# Patient Record
Sex: Male | Born: 1950 | Race: White | Hispanic: No | Marital: Married | Smoking: Never smoker
Health system: Southern US, Community
[De-identification: ages and names within clinical notes are randomized; demographics above are authoritative.]

## PROBLEM LIST (undated history)

## (undated) DIAGNOSIS — I10 Essential (primary) hypertension: Secondary | ICD-10-CM

## (undated) DIAGNOSIS — N529 Male erectile dysfunction, unspecified: Secondary | ICD-10-CM

## (undated) DIAGNOSIS — R7301 Impaired fasting glucose: Secondary | ICD-10-CM

## (undated) DIAGNOSIS — I2510 Atherosclerotic heart disease of native coronary artery without angina pectoris: Secondary | ICD-10-CM

## (undated) DIAGNOSIS — B029 Zoster without complications: Secondary | ICD-10-CM

## (undated) DIAGNOSIS — E785 Hyperlipidemia, unspecified: Secondary | ICD-10-CM

## (undated) HISTORY — DX: Zoster without complications: B02.9

## (undated) HISTORY — DX: Essential (primary) hypertension: I10

## (undated) HISTORY — PX: OTHER SURGICAL HISTORY: SHX169

## (undated) HISTORY — DX: Male erectile dysfunction, unspecified: N52.9

## (undated) HISTORY — DX: Hyperlipidemia, unspecified: E78.5

## (undated) HISTORY — DX: Impaired fasting glucose: R73.01

## (undated) HISTORY — DX: Atherosclerotic heart disease of native coronary artery without angina pectoris: I25.10

---
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
## 1994-05-21 HISTORY — PX: CORONARY ARTERY BYPASS GRAFT: SHX141

## 2000-02-09 ENCOUNTER — Ambulatory Visit (HOSPITAL_COMMUNITY): Admission: RE | Admit: 2000-02-09 | Discharge: 2000-02-09 | Payer: Self-pay | Admitting: Gastroenterology

## 2006-05-21 HISTORY — PX: SHOULDER SURGERY: SHX246

## 2006-07-10 ENCOUNTER — Ambulatory Visit (HOSPITAL_BASED_OUTPATIENT_CLINIC_OR_DEPARTMENT_OTHER): Admission: RE | Admit: 2006-07-10 | Discharge: 2006-07-10 | Payer: Self-pay | Admitting: Orthopedic Surgery

## 2011-01-10 ENCOUNTER — Ambulatory Visit (INDEPENDENT_AMBULATORY_CARE_PROVIDER_SITE_OTHER): Payer: PRIVATE HEALTH INSURANCE | Admitting: Family Medicine

## 2011-01-10 ENCOUNTER — Encounter: Payer: Self-pay | Admitting: Family Medicine

## 2011-01-10 VITALS — BP 142/82 | HR 64 | Ht 71.0 in | Wt 215.0 lb

## 2011-01-10 DIAGNOSIS — I10 Essential (primary) hypertension: Secondary | ICD-10-CM | POA: Insufficient documentation

## 2011-01-10 DIAGNOSIS — Z125 Encounter for screening for malignant neoplasm of prostate: Secondary | ICD-10-CM

## 2011-01-10 DIAGNOSIS — Z0000 Encounter for general adult medical examination without abnormal findings: Secondary | ICD-10-CM

## 2011-01-10 DIAGNOSIS — Z79899 Other long term (current) drug therapy: Secondary | ICD-10-CM

## 2011-01-10 DIAGNOSIS — I2510 Atherosclerotic heart disease of native coronary artery without angina pectoris: Secondary | ICD-10-CM | POA: Insufficient documentation

## 2011-01-10 LAB — COMPREHENSIVE METABOLIC PANEL
ALT: 24 U/L (ref 0–53)
Albumin: 5.1 g/dL (ref 3.5–5.2)
Alkaline Phosphatase: 73 U/L (ref 39–117)
CO2: 28 mEq/L (ref 19–32)
Glucose, Bld: 87 mg/dL (ref 70–99)
Potassium: 4 mEq/L (ref 3.5–5.3)
Sodium: 139 mEq/L (ref 135–145)
Total Protein: 6.8 g/dL (ref 6.0–8.3)

## 2011-01-10 LAB — POCT URINALYSIS DIPSTICK
Bilirubin, UA: NEGATIVE
Glucose, UA: NEGATIVE
Leukocytes, UA: NEGATIVE
Nitrite, UA: NEGATIVE

## 2011-01-10 LAB — LIPID PANEL
LDL Cholesterol: 96 mg/dL (ref 0–99)
Triglycerides: 78 mg/dL (ref <150)

## 2011-01-10 NOTE — Progress Notes (Signed)
Jackson Reed is a 60 y.o. male who presents for a complete physical.  He has the following concerns: Here to establish care.  He has no specific concerns.  He is fasting for labs--last labs were done at [facility] [place]--believes it was in March (and doesn't recall which tests done).  Previously took Niaspan (in addition to statin) after [facility] heart lab studies done (particle size). Stopped it due to possible concern using it together with Lipitor.  Had flushing from OTC Niacin. Started the red yeast rice on his own, and has been taking for about a year.  Pt states that BP usually 120/70 at his office  There is no immunization history on file for this patient. Can't recall last tetanus shot--had a laceration treated in ER about 5 years ago.  Will check records Never had pneumovax Last colonoscopy: spring 2012 with Dr. Kinnie Scales Last PSA: unsure if done with labs in Spring or not  Past Medical History  Diagnosis Date  . CAD (coronary artery disease)     s/p CABG 1996 (sees Dr. Alanda Amass)  . Erectile dysfunction   . Hyperlipidemia   . Hypertension     Past Surgical History  Procedure Date  . Appendectomy 1968  . Coronary artery bypass graft 1996  . Shoulder surgery 2008    (arthroscopic) L for chronic dislocation Dr. Eulah Pont    History   Social History  . Marital Status: Single    Spouse Name: N/A    Number of Children: 2  . Years of Education: N/A   Occupational History  . oral surgeon    Social History Main Topics  . Smoking status: Never Smoker   . Smokeless tobacco: Never Used  . Alcohol Use: Yes     3-4 glasses wine a week.  . Drug Use: No  . Sexually Active: Yes -- Male partner(s)   Other Topics Concern  . Not on file   Social History Narrative   Engaged, 1 son and 1 daughter    Family History  Problem Relation Age of Onset  . Cancer Mother     renal cell CA and breast CA  . Cancer Father     prostate cancer in his 22's  . Alzheimer's  disease Father   . Heart disease Father     CABG  . Cancer Brother     lung cancer (nonsmoker)    Current outpatient prescriptions:amLODipine (NORVASC) 5 MG tablet, Take 5 mg by mouth daily.  , Disp: , Rfl: ;  Ascorbic Acid (VITAMIN C) 1000 MG tablet, Take 1,000 mg by mouth daily.  , Disp: , Rfl: ;  aspirin 81 MG tablet, Take 81 mg by mouth daily.  , Disp: , Rfl: ;  atorvastatin (LIPITOR) 40 MG tablet, Take 40 mg by mouth daily.  , Disp: , Rfl: ;  b complex vitamins tablet, Take 1 tablet by mouth daily.  , Disp: , Rfl:  Coenzyme Q10 300 MG CAPS, Take 1 capsule by mouth daily.  , Disp: , Rfl: ;  Fiber TABS, Take 1 tablet by mouth daily.  , Disp: , Rfl: ;  lisinopril-hydrochlorothiazide (PRINZIDE,ZESTORETIC) 20-12.5 MG per tablet, Take 0.5 tablets by mouth daily.  , Disp: , Rfl: ;  OMEGA 3 1200 MG CAPS, Take 2 capsules by mouth daily.  , Disp: , Rfl: ;  pindolol (VISKEN) 5 MG tablet, Take 2.5 mg by mouth daily.  , Disp: , Rfl:  Red Yeast Rice 600 MG TABS, Take 2  tablets by mouth daily.  , Disp: , Rfl: ;  vitamin E 400 UNIT capsule, Take 400 Units by mouth daily.  , Disp: , Rfl:   No Known Allergies  ROS: The patient denies anorexia, fever, weight changes, headaches,  vision loss, decreased hearing, ear pain, hoarseness, chest pain, palpitations, dizziness, syncope, dyspnea on exertion, cough, swelling, nausea, vomiting, diarrhea, constipation, abdominal pain, melena, hematochezia, indigestion/heartburn, hematuria, incontinence, nocturia, weakened urine stream, dysuria, genital lesions, joint pains, numbness, tingling, weakness, tremor, suspicious skin lesions, depression, anxiety, abnormal bleeding/bruising, or enlarged lymph nodes. +ED, but improved, infrequent problems, only when very tired  PHYSICAL EXAM:  BP 142/82  Pulse 64  Ht 5\' 11"  (1.803 m)  Wt 215 lb (97.523 kg)  BMI 29.99 kg/m2  General Appearance:    Alert, cooperative, no distress, appears stated age  Head:    Normocephalic,  without obvious abnormality, atraumatic  Eyes:    PERRL, conjunctiva/corneas clear, EOM's intact, fundi    benign  Ears:    Normal TM's and external ear canals  Nose:   Nares normal, mucosa normal, no drainage or sinus   tenderness  Throat:   Lips, mucosa, and tongue normal; teeth and gums normal  Neck:   Supple, no lymphadenopathy;  thyroid:  no   enlargement/tenderness/nodules; no carotid   bruit or JVD  Back:    Spine nontender, no curvature, ROM normal, no CVA     tenderness  Lungs:     Clear to auscultation bilaterally without wheezes, rales or     ronchi; respirations unlabored  Chest Wall:    No tenderness or deformity. WHSS   Heart:    Regular rate and rhythm, S1 and S2 normal, no murmur, rub   or gallop  Breast Exam:    No chest wall tenderness, masses or gynecomastia  Abdomen:     Soft, non-tender, nondistended, normoactive bowel sounds,    no masses, no hepatosplenomegaly  Genitalia:    Normal male external genitalia without lesions.  Testicles without masses.  No inguinal hernias.  Rectal:    Normal sphincter tone, no masses or tenderness; guaiac negative stool.  Prostate smooth, no nodules, not enlarged.  Extremities:   No clubbing, cyanosis or edema  Pulses:   2+ and symmetric all extremities  Skin:   Skin color, texture, turgor normal, no rashes or lesions  Lymph nodes:   Cervical, supraclavicular, and axillary nodes normal  Neurologic:   CNII-XII intact, normal strength, sensation and gait; reflexes 2+ and symmetric throughout          Psych:   Normal mood, affect, hygiene and grooming.    ASSESSMENT/PLAN: 1. Routine general medical examination at a health care facility  POCT Urinalysis Dipstick, Visual acuity screening  2. Special screening for malignant neoplasm of prostate  PSA  3. Encounter for long-term (current) use of other medications  CBC with Differential, Comprehensive metabolic panel, Lipid panel  4. CAD (coronary artery disease)    5. Essential hypertension,  benign      Discussed PSA screening (risks/benefits), recommended at least 30 minutes of aerobic activity at least 5 days/week; proper sunscreen use reviewed; healthy diet and alcohol recommendations (less than or equal to 2 drinks/day) reviewed; regular seatbelt use; changing batteries in smoke detectors. Self-testicular exams. Immunization recommendations discussed--will look into when last tetanus was.  Discussed Zostavax and will look into insurance coverage.  Colonoscopy recommendations reviewed--UTD, due again in 5 years.  Stop red yeast rice--continue with Lipitor.   Can consider restarting  Niacin/Niaspan  Continue to monitor BP's at work, and ensure <130/80

## 2011-01-10 NOTE — Patient Instructions (Addendum)
HEALTH MAINTENANCE RECOMMENDATIONS:  It is recommended that you get at least 30 minutes of aerobic exercise at least 5 days/week (for weight loss, you may need as much as 60-90 minutes). This can be any activity that gets your heart rate up. This can be divided in 10-15 minute intervals if needed, but try and build up your endurance at least once a week.  Weight bearing exercise is also recommended twice weekly.  Eat a healthy diet with lots of vegetables, fruits and fiber.  "Colorful" foods have a lot of vitamins (ie green vegetables, tomatoes, red peppers, etc).  Limit sweet tea, regular sodas and alcoholic beverages, all of which has a lot of calories and sugar.  Up to 2 alcoholic drinks daily may be beneficial for women (unless trying to lose weight, watch sugars).  Drink a lot of water.  Sunscreen of at least SPF 30 should be used on all sun-exposed parts of the skin when outside between the hours of 10 am and 4 pm (not just when at beach or pool, but even with exercise, golf, tennis, and yard work!)  Use a sunscreen that says "broad spectrum" so it covers both UVA and UVB rays, and make sure to reapply every 1-2 hours.  Remember to change the batteries in your smoke detectors when changing your clock times in the spring and fall.  Use your seat belt every time you are in a car, and please drive safely and not be distracted with cell phones and texting while driving.   Please try and see when your last tetanus shot was Check with your insurance regarding Zostavax (shingles vaccine coverage)  Stop red yeast rice

## 2011-01-11 ENCOUNTER — Encounter: Payer: Self-pay | Admitting: Family Medicine

## 2011-01-11 ENCOUNTER — Telehealth: Payer: Self-pay | Admitting: *Deleted

## 2011-01-11 LAB — CBC WITH DIFFERENTIAL/PLATELET
Basophils Relative: 0 % (ref 0–1)
Hemoglobin: 14.3 g/dL (ref 13.0–17.0)
Lymphs Abs: 1.4 10*3/uL (ref 0.7–4.0)
Monocytes Relative: 9 % (ref 3–12)
Neutro Abs: 3.7 10*3/uL (ref 1.7–7.7)
Neutrophils Relative %: 63 % (ref 43–77)
RBC: 4.61 MIL/uL (ref 4.22–5.81)

## 2011-01-11 LAB — PSA: PSA: 1.69 ng/mL (ref <=4.00)

## 2011-01-11 NOTE — Telephone Encounter (Signed)
Spoke with patient he is going to instead of having his Lipitor increased to 80mg , try OTC niacin. He will call back to schedule lab visit for 3rd week in Nov in the am for fasting lipid.

## 2011-01-26 ENCOUNTER — Telehealth: Payer: Self-pay | Admitting: Family Medicine

## 2011-01-26 MED ORDER — AMLODIPINE BESYLATE 5 MG PO TABS: 5.0000 mg | ORAL_TABLET | Freq: Every day | ORAL | Status: DC

## 2011-01-26 MED ORDER — PINDOLOL 5 MG PO TABS: 2.5000 mg | ORAL_TABLET | Freq: Every day | ORAL | Status: DC

## 2011-01-26 NOTE — Telephone Encounter (Signed)
Reordered med

## 2011-02-22 ENCOUNTER — Telehealth: Payer: Self-pay | Admitting: Family Medicine

## 2011-02-22 DIAGNOSIS — E7800 Pure hypercholesterolemia, unspecified: Secondary | ICD-10-CM

## 2011-02-22 DIAGNOSIS — I10 Essential (primary) hypertension: Secondary | ICD-10-CM

## 2011-02-22 MED ORDER — LISINOPRIL-HYDROCHLOROTHIAZIDE 20-12.5 MG PO TABS: 0.5000 | ORAL_TABLET | Freq: Every day | ORAL | Status: DC

## 2011-02-22 MED ORDER — ATORVASTATIN CALCIUM 40 MG PO TABS: 40.0000 mg | ORAL_TABLET | Freq: Every day | ORAL | Status: DC

## 2011-02-22 MED ORDER — AMLODIPINE BESYLATE 5 MG PO TABS: 5.0000 mg | ORAL_TABLET | Freq: Every day | ORAL | Status: DC

## 2011-02-22 NOTE — Telephone Encounter (Signed)
Advise pt e-rx'd (6 months of BP meds, Lipitor to last until due for labs in November (2 mos)

## 2011-02-22 NOTE — Telephone Encounter (Signed)
Called patient to inform him that meds were called in by Dr.Knapp. 6 months of BP meds and 2 months of Lipitor. Patient is scheduled for fasting labs 04/05/11.

## 2011-02-22 NOTE — Telephone Encounter (Signed)
PT called and asked that we refill his Lisinopril HCTZ, Lipitor, Meldapine at [facility] on [address]

## 2011-03-02 ENCOUNTER — Other Ambulatory Visit: Payer: Self-pay | Admitting: Family Medicine

## 2011-03-02 DIAGNOSIS — N529 Male erectile dysfunction, unspecified: Secondary | ICD-10-CM

## 2011-03-05 NOTE — Telephone Encounter (Signed)
done

## 2011-03-05 NOTE — Telephone Encounter (Signed)
IS THIS OK 

## 2011-04-05 ENCOUNTER — Other Ambulatory Visit: Payer: PRIVATE HEALTH INSURANCE

## 2011-04-05 DIAGNOSIS — Z79899 Other long term (current) drug therapy: Secondary | ICD-10-CM

## 2011-04-05 DIAGNOSIS — I10 Essential (primary) hypertension: Secondary | ICD-10-CM

## 2011-04-05 DIAGNOSIS — I2510 Atherosclerotic heart disease of native coronary artery without angina pectoris: Secondary | ICD-10-CM

## 2011-04-05 LAB — HEPATIC FUNCTION PANEL
ALT: 28 U/L (ref 0–53)
AST: 25 U/L (ref 0–37)
Alkaline Phosphatase: 76 U/L (ref 39–117)
Bilirubin, Direct: 0.2 mg/dL (ref 0.0–0.3)
Indirect Bilirubin: 0.4 mg/dL (ref 0.0–0.9)

## 2011-04-05 LAB — LIPID PANEL: Cholesterol: 143 mg/dL (ref 0–200)

## 2011-04-06 ENCOUNTER — Encounter: Payer: Self-pay | Admitting: Family Medicine

## 2011-04-09 ENCOUNTER — Other Ambulatory Visit: Payer: Self-pay | Admitting: *Deleted

## 2011-04-09 DIAGNOSIS — I2510 Atherosclerotic heart disease of native coronary artery without angina pectoris: Secondary | ICD-10-CM

## 2011-04-09 MED ORDER — NIACIN ER (ANTIHYPERLIPIDEMIC) 500 MG PO TBCR: 500.0000 mg | EXTENDED_RELEASE_TABLET | Freq: Every day | ORAL | Status: DC

## 2011-05-07 ENCOUNTER — Telehealth: Payer: Self-pay | Admitting: Internal Medicine

## 2011-05-07 DIAGNOSIS — E7800 Pure hypercholesterolemia, unspecified: Secondary | ICD-10-CM

## 2011-05-07 MED ORDER — ATORVASTATIN CALCIUM 40 MG PO TABS: 40.0000 mg | ORAL_TABLET | Freq: Every day | ORAL | Status: DC

## 2011-05-14 ENCOUNTER — Telehealth: Payer: Self-pay | Admitting: Internal Medicine

## 2011-05-14 MED ORDER — PINDOLOL 5 MG PO TABS: 2.5000 mg | ORAL_TABLET | Freq: Every day | ORAL | Status: DC

## 2011-05-14 NOTE — Telephone Encounter (Signed)
done

## 2011-05-16 ENCOUNTER — Telehealth: Payer: Self-pay | Admitting: Internal Medicine

## 2011-05-16 NOTE — Telephone Encounter (Signed)
Done

## 2011-05-17 NOTE — Telephone Encounter (Signed)
This is a duplicate--was done 12/24, see previous message

## 2011-05-24 ENCOUNTER — Other Ambulatory Visit: Payer: Self-pay | Admitting: *Deleted

## 2011-05-24 DIAGNOSIS — I2510 Atherosclerotic heart disease of native coronary artery without angina pectoris: Secondary | ICD-10-CM

## 2011-05-24 MED ORDER — PINDOLOL 5 MG PO TABS: 2.5000 mg | ORAL_TABLET | Freq: Every day | ORAL | Status: DC

## 2011-07-02 ENCOUNTER — Other Ambulatory Visit: Payer: Self-pay | Admitting: Family Medicine

## 2011-07-02 ENCOUNTER — Other Ambulatory Visit: Payer: Self-pay | Admitting: *Deleted

## 2011-07-02 DIAGNOSIS — Z79899 Other long term (current) drug therapy: Secondary | ICD-10-CM

## 2011-07-02 DIAGNOSIS — I2510 Atherosclerotic heart disease of native coronary artery without angina pectoris: Secondary | ICD-10-CM

## 2011-07-02 MED ORDER — NIACIN ER (ANTIHYPERLIPIDEMIC) 500 MG PO TBCR: 500.0000 mg | EXTENDED_RELEASE_TABLET | Freq: Every day | ORAL | Status: DC

## 2011-07-02 NOTE — Telephone Encounter (Signed)
Spoke with patient and let him know I refilled his Niaspan and that he is due for lipids and liver panels. He said he will call me back to schedule sometime in March-I put in orders.

## 2011-07-03 ENCOUNTER — Telehealth: Payer: Self-pay | Admitting: Family Medicine

## 2011-07-03 NOTE — Telephone Encounter (Signed)
Pt secretary called and advised pt stated he needed follow up lab.  I set him up for pt .  Please advise if pt does have pending labs.

## 2011-07-12 ENCOUNTER — Encounter: Payer: Self-pay | Admitting: Internal Medicine

## 2011-07-18 ENCOUNTER — Other Ambulatory Visit: Payer: PRIVATE HEALTH INSURANCE

## 2011-07-18 DIAGNOSIS — Z79899 Other long term (current) drug therapy: Secondary | ICD-10-CM

## 2011-07-18 DIAGNOSIS — I2510 Atherosclerotic heart disease of native coronary artery without angina pectoris: Secondary | ICD-10-CM

## 2011-07-18 LAB — HEPATIC FUNCTION PANEL
ALT: 28 U/L (ref 0–53)
AST: 27 U/L (ref 0–37)
Alkaline Phosphatase: 59 U/L (ref 39–117)
Bilirubin, Direct: 0.1 mg/dL (ref 0.0–0.3)
Total Bilirubin: 0.5 mg/dL (ref 0.3–1.2)

## 2011-07-18 LAB — LIPID PANEL: Cholesterol: 159 mg/dL (ref 0–200)

## 2011-07-25 ENCOUNTER — Encounter: Payer: Self-pay | Admitting: Family Medicine

## 2011-07-25 ENCOUNTER — Ambulatory Visit (INDEPENDENT_AMBULATORY_CARE_PROVIDER_SITE_OTHER): Payer: PRIVATE HEALTH INSURANCE | Admitting: Family Medicine

## 2011-07-25 DIAGNOSIS — I10 Essential (primary) hypertension: Secondary | ICD-10-CM

## 2011-07-25 DIAGNOSIS — I2510 Atherosclerotic heart disease of native coronary artery without angina pectoris: Secondary | ICD-10-CM

## 2011-07-25 DIAGNOSIS — E7800 Pure hypercholesterolemia, unspecified: Secondary | ICD-10-CM | POA: Insufficient documentation

## 2011-07-25 MED ORDER — ATORVASTATIN CALCIUM 40 MG PO TABS: ORAL_TABLET | ORAL | Status: DC

## 2011-07-25 NOTE — Patient Instructions (Signed)
Follow up with an office visit with your cardiologist. Increase the lipitor to 2 tablets 3days/week, and 1 daily on all other days (since you are hesitant to increase to 80 mg daily).  Continue the Niaspan

## 2011-07-25 NOTE — Progress Notes (Signed)
Pt presents for 6 month f/u.  Had labs done prior to visit  He wonders whether or not stress cardiolite tests should be done yearly  HPI: Hypertension follow-up:  Blood pressures elsewhere are okay.  Denies dizziness, headaches, chest pain.  Denies side effects of medications.  Hyperlipidemia follow-up:  Patient is reportedly following a low-fat, low cholesterol diet.  Compliant with medications and denies medication side effects.  Worries about potential increased risk of ALS with Lipitor.  Tolerating Niaspan without side effects.  Coronary artery disease: He denies any chest pain, palpitations, dyspnea on exertion.  He remains very active--doing spin classes, and [employer] program (slowly introducing). He questions the fact that he gets yearly cardiolite stress tests, last 06/2010, wondering if it is truly necessary.  Hasn't seen the Dr. Alanda Amass in the office for an office visit in quite a while, just for the stress tests. He denies any problems, and feels great.  He recently remarried.  Past Medical History  Diagnosis Date  . CAD (coronary artery disease)     s/p CABG 1996 (sees Dr. Alanda Amass)  . Erectile dysfunction   . Hyperlipidemia   . Hypertension     Past Surgical History  Procedure Date  . Appendectomy 1968  . Coronary artery bypass graft 1996  . Shoulder surgery 2008    (arthroscopic) L for chronic dislocation Dr. Eulah Pont    History   Social History  . Marital Status: Single    Spouse Name: N/A    Number of Children: 2  . Years of Education: N/A   Occupational History  . oral surgeon    Social History Main Topics  . Smoking status: Never Smoker   . Smokeless tobacco: Never Used  . Alcohol Use: Yes     3-4 glasses wine a week.  . Drug Use: No  . Sexually Active: Yes -- Male partner(s)   Other Topics Concern  . Not on file   Social History Narrative   Divorced, and remarried 04/2011. 1 son and 1 daughter (not currently in contact with daughter)    Family  History  Problem Relation Age of Onset  . Cancer Mother     renal cell CA and breast CA  . Cancer Father     prostate cancer in his 79's  . Alzheimer's disease Father   . Heart disease Father     CABG  . Cancer Brother     lung cancer (nonsmoker)   Current Outpatient Prescriptions on File Prior to Visit  Medication Sig Dispense Refill  . amLODipine (NORVASC) 5 MG tablet Take 1 tablet (5 mg total) by mouth daily.  30 tablet  5  . Ascorbic Acid (VITAMIN C) 1000 MG tablet Take 1,000 mg by mouth daily.        Marland Kitchen aspirin 81 MG tablet Take 81 mg by mouth daily.        Marland Kitchen atorvastatin (LIPITOR) 40 MG tablet 2 tablets on Mon, Wed, Fri and 1 tablet daily on all other days  135 tablet  1  . b complex vitamins tablet Take 1 tablet by mouth daily.        . Coenzyme Q10 300 MG CAPS Take 1 capsule by mouth daily.        . Fiber TABS Take 1 tablet by mouth daily.        Marland Kitchen lisinopril-hydrochlorothiazide (PRINZIDE,ZESTORETIC) 20-12.5 MG per tablet Take 0.5 tablets by mouth daily.  45 tablet  1  . niacin (NIASPAN) 500 MG CR tablet  Take 1 tablet (500 mg total) by mouth at bedtime.  30 tablet  2  . OMEGA 3 1200 MG CAPS Take 2 capsules by mouth daily.        . pindolol (VISKEN) 5 MG tablet Take 0.5 tablets (2.5 mg total) by mouth daily.  30 tablet  1  . VIAGRA 100 MG tablet TAKE 1/2 TO 1 TABLET BY MOUTH EVERY DAY AS NEEDED FOR ED  15 tablet  5  . vitamin E 400 UNIT capsule Take 400 Units by mouth daily.          No Known Allergies  ROS: Denies fever, URI symptoms, cough, shortness of breath, chest pain, palpitations, nausea, vomiting, diarrhea, or other concerns.   PHYSICAL EXAM: BP 132/78  Pulse 72  Ht 5\' 11"  (1.803 m)  Wt 220 lb (99.791 kg)  BMI 30.68 kg/m2 Well developed, pleasant male in no distress HEENT:  PERRL, conjunctiva clear, sclera anicteric Neck: no lymphadenopathy, thyromegaly or carotid bruit Heart: regular rate and rhythm without murmur Lungs: clear bilaterally Abdomen: soft,  nontender, nondistended, no organomegaly or mass Extremities:  No edema, 2+ pulses Skin: no rash Psych: normal mood, affect, hygiene and grooming  Lab Results  Component Value Date   CHOL 159 07/18/2011   CHOL 143 04/05/2011   CHOL 163 01/10/2011   Lab Results  Component Value Date   HDL 46 07/18/2011   HDL 41 16/02/9603   HDL 51 01/10/2011   Lab Results  Component Value Date   LDLCALC 97 07/18/2011   LDLCALC 84 04/05/2011   LDLCALC 96 01/10/2011   Lab Results  Component Value Date   TRIG 78 07/18/2011   TRIG 92 04/05/2011   TRIG 78 01/10/2011   Lab Results  Component Value Date   CHOLHDL 3.5 07/18/2011   CHOLHDL 3.5 04/05/2011   CHOLHDL 3.2 01/10/2011   No results found for this basename: LDLDIRECT    ASSESSMENT/PLAN:  1. CAD (coronary artery disease)    2. Essential hypertension, benign    3. Pure hypercholesterolemia  atorvastatin (LIPITOR) 40 MG tablet, Lipid panel, Hepatic function panel   Lipids not at goal--goal LDL<70.  Discussed increasing Niaspan vs increasing Lipitor.  He is hesitant to increase to 80 mg daily, and likely can get by with less, so will increase to 80mg  on MWF and remain at 40mg  on other days.  Recheck labs in 2-3 months. Continue low cholesterol diet  HTN--controlled. Continue checking BP elsewhere  CAD--stable.  I recommended scheduling OV with cardiologist to discuss his concerns.  I don't have reports/results to understanding reasoning behind current strategy of testing, but in general it appears excessive for someone as active as he is, without symptoms.

## 2011-07-26 ENCOUNTER — Encounter: Payer: Self-pay | Admitting: Family Medicine

## 2011-08-16 ENCOUNTER — Telehealth: Payer: Self-pay | Admitting: Internal Medicine

## 2011-08-16 DIAGNOSIS — I2510 Atherosclerotic heart disease of native coronary artery without angina pectoris: Secondary | ICD-10-CM

## 2011-08-16 DIAGNOSIS — I10 Essential (primary) hypertension: Secondary | ICD-10-CM

## 2011-08-16 MED ORDER — PINDOLOL 5 MG PO TABS: 2.5000 mg | ORAL_TABLET | Freq: Every day | ORAL | Status: DC

## 2011-08-16 MED ORDER — AMLODIPINE BESYLATE 5 MG PO TABS: 5.0000 mg | ORAL_TABLET | Freq: Every day | ORAL | Status: DC

## 2011-08-16 MED ORDER — LISINOPRIL-HYDROCHLOROTHIAZIDE 20-12.5 MG PO TABS: 0.5000 | ORAL_TABLET | Freq: Every day | ORAL | Status: DC

## 2011-08-16 NOTE — Telephone Encounter (Signed)
Done

## 2011-08-22 ENCOUNTER — Telehealth: Payer: Self-pay | Admitting: *Deleted

## 2011-08-22 NOTE — Telephone Encounter (Signed)
Spoke with pt.  He is taking 80mg  only 3 days/week, 40 mg on other days.  Hasn't had any myalgias or side effects, has just been hesitant to increase to 80mg  dose.  Discussed increasing to 80mg  every day, to call if he doesn't tolerate, in which case we can try 40mg  of Crestor.  Discussed his pre-diabetic blood sugar--continue exercise, weight loss, cut back on sweets/carbs/alcohol.  He will let us know when he needs refill on lipitor--if tolerating, will need change to 80 mg dose daily.  He will fax labs tomorrow.  Has appt with cardiologist next week

## 2011-08-22 NOTE — Telephone Encounter (Signed)
Patient called and wanted to speak with you directly, you were in with a patient so I got some info. He would still like you to call him to discuss his LDL. He had some labs drawn @ [facility] (for docs) and his LDL is 105, it was 97 on 07/18/11. PSA was 1.52, and his fasting glucose (6hr fast) was 110-he stated that he is working on losing some weight. He wants to talk about maybe trying something different for his lipids, maybe getting back on red yeast rice, he is so concerned over the increase in Lipitor to 80 mg (~4 weeks ago) and his numbers elevating. Also he stated that his liver enzymes were good, as he had a CMET also. He will fax to [place]. Please call. Thanks.

## 2011-08-30 ENCOUNTER — Other Ambulatory Visit: Payer: Self-pay | Admitting: *Deleted

## 2011-08-30 MED ORDER — ATORVASTATIN CALCIUM 80 MG PO TABS: 80.0000 mg | ORAL_TABLET | Freq: Every day | ORAL | Status: DC

## 2011-08-31 ENCOUNTER — Telehealth: Payer: Self-pay | Admitting: Family Medicine

## 2011-08-31 DIAGNOSIS — I10 Essential (primary) hypertension: Secondary | ICD-10-CM

## 2011-08-31 MED ORDER — LISINOPRIL-HYDROCHLOROTHIAZIDE 20-12.5 MG PO TABS: 0.5000 | ORAL_TABLET | Freq: Every day | ORAL | Status: DC

## 2011-08-31 NOTE — Telephone Encounter (Signed)
Done

## 2011-09-19 HISTORY — PX: FINGER SURGERY: SHX640

## 2011-09-25 ENCOUNTER — Other Ambulatory Visit: Payer: PRIVATE HEALTH INSURANCE

## 2011-09-25 DIAGNOSIS — E7800 Pure hypercholesterolemia, unspecified: Secondary | ICD-10-CM

## 2011-09-25 LAB — LIPID PANEL
HDL: 41 mg/dL (ref >39)
Triglycerides: 61 mg/dL (ref <150)

## 2011-09-25 LAB — HEPATIC FUNCTION PANEL
AST: 122 U/L — ABNORMAL HIGH (ref 0–37)
Albumin: 4.2 g/dL (ref 3.5–5.2)
Alkaline Phosphatase: 61 U/L (ref 39–117)
Total Protein: 5.8 g/dL — ABNORMAL LOW (ref 6.0–8.3)

## 2011-09-26 ENCOUNTER — Encounter: Payer: Self-pay | Admitting: Family Medicine

## 2011-09-26 ENCOUNTER — Other Ambulatory Visit: Payer: Self-pay | Admitting: *Deleted

## 2011-09-26 DIAGNOSIS — R748 Abnormal levels of other serum enzymes: Secondary | ICD-10-CM

## 2011-09-27 ENCOUNTER — Other Ambulatory Visit: Payer: Self-pay | Admitting: *Deleted

## 2011-09-27 ENCOUNTER — Telehealth: Payer: Self-pay | Admitting: *Deleted

## 2011-09-27 DIAGNOSIS — E7800 Pure hypercholesterolemia, unspecified: Secondary | ICD-10-CM

## 2011-09-27 MED ORDER — EZETIMIBE 10 MG PO TABS: 10.0000 mg | ORAL_TABLET | Freq: Every day | ORAL | Status: DC

## 2011-09-27 NOTE — Telephone Encounter (Signed)
I do NOT recommend cutting back to 40mg , as we have proven this to be ineffective in getting lipids down to goal.  If he wants to go down to 40mg , then ALSO start zetia 10mg  daily.  Recheck LFT's in 4 weeks (and avoid alcohol), and plan on checking LFT's and lipids again after being on current new med regimen x 2 months

## 2011-09-27 NOTE — Telephone Encounter (Signed)
Patient called and said that he was thinking last night before he went to bed and really doesn't want to continue on the 80mg  of Lipitor. Can he cut back to 40 possibly and recheck in 4 weeks? Or any suggestions? Please advise.

## 2011-09-27 NOTE — Telephone Encounter (Signed)
Called patient back and he will begin taking his Lipitor 40mg  and Zetia 10mg  once daily. He is scheduled for 10/24/11 for liver check and he is also scheduled for 12/06/11 for lipid and liver check. Called in Zetia 10mg  #30 once daily with 2 refills to [facility].

## 2011-10-08 ENCOUNTER — Telehealth: Payer: Self-pay | Admitting: Internal Medicine

## 2011-10-08 DIAGNOSIS — I2510 Atherosclerotic heart disease of native coronary artery without angina pectoris: Secondary | ICD-10-CM

## 2011-10-08 MED ORDER — NIACIN ER (ANTIHYPERLIPIDEMIC) 500 MG PO TBCR: 500.0000 mg | EXTENDED_RELEASE_TABLET | Freq: Every day | ORAL | Status: DC

## 2011-10-08 NOTE — Telephone Encounter (Signed)
Filled niaspan 500mg  ER #30 1 refill to [facility] on [address]

## 2011-10-23 ENCOUNTER — Other Ambulatory Visit: Payer: PRIVATE HEALTH INSURANCE

## 2011-10-23 DIAGNOSIS — R748 Abnormal levels of other serum enzymes: Secondary | ICD-10-CM

## 2011-10-23 LAB — HEPATIC FUNCTION PANEL
ALT: 33 U/L (ref 0–53)
AST: 27 U/L (ref 0–37)
Albumin: 4.4 g/dL (ref 3.5–5.2)
Total Bilirubin: 0.8 mg/dL (ref 0.3–1.2)
Total Protein: 6 g/dL (ref 6.0–8.3)

## 2011-10-24 ENCOUNTER — Other Ambulatory Visit: Payer: Self-pay | Admitting: *Deleted

## 2011-10-24 ENCOUNTER — Other Ambulatory Visit: Payer: PRIVATE HEALTH INSURANCE

## 2011-10-24 DIAGNOSIS — E7800 Pure hypercholesterolemia, unspecified: Secondary | ICD-10-CM

## 2011-10-24 DIAGNOSIS — R748 Abnormal levels of other serum enzymes: Secondary | ICD-10-CM

## 2011-11-27 ENCOUNTER — Other Ambulatory Visit: Payer: PRIVATE HEALTH INSURANCE

## 2011-12-05 ENCOUNTER — Telehealth: Payer: Self-pay | Admitting: Family Medicine

## 2011-12-05 DIAGNOSIS — I2510 Atherosclerotic heart disease of native coronary artery without angina pectoris: Secondary | ICD-10-CM

## 2011-12-05 MED ORDER — NIACIN ER (ANTIHYPERLIPIDEMIC) 500 MG PO TBCR: 500.0000 mg | EXTENDED_RELEASE_TABLET | Freq: Every day | ORAL | Status: DC

## 2011-12-05 NOTE — Telephone Encounter (Signed)
Done

## 2011-12-06 ENCOUNTER — Other Ambulatory Visit: Payer: PRIVATE HEALTH INSURANCE

## 2011-12-06 DIAGNOSIS — E7800 Pure hypercholesterolemia, unspecified: Secondary | ICD-10-CM

## 2011-12-06 DIAGNOSIS — R748 Abnormal levels of other serum enzymes: Secondary | ICD-10-CM

## 2011-12-06 LAB — HEPATIC FUNCTION PANEL
ALT: 33 U/L (ref 0–53)
AST: 25 U/L (ref 0–37)
Alkaline Phosphatase: 64 U/L (ref 39–117)
Bilirubin, Direct: 0.2 mg/dL (ref 0.0–0.3)
Indirect Bilirubin: 0.5 mg/dL (ref 0.0–0.9)
Total Bilirubin: 0.7 mg/dL (ref 0.3–1.2)

## 2011-12-06 LAB — LIPID PANEL: Cholesterol: 112 mg/dL (ref 0–200)

## 2011-12-10 ENCOUNTER — Other Ambulatory Visit: Payer: Self-pay | Admitting: *Deleted

## 2011-12-10 DIAGNOSIS — I2510 Atherosclerotic heart disease of native coronary artery without angina pectoris: Secondary | ICD-10-CM

## 2011-12-10 DIAGNOSIS — E7800 Pure hypercholesterolemia, unspecified: Secondary | ICD-10-CM

## 2011-12-10 MED ORDER — EZETIMIBE 10 MG PO TABS: 10.0000 mg | ORAL_TABLET | Freq: Every day | ORAL | Status: DC

## 2011-12-10 MED ORDER — NIACIN ER (ANTIHYPERLIPIDEMIC) 500 MG PO TBCR
500.0000 mg | EXTENDED_RELEASE_TABLET | Freq: Every day | ORAL | Status: DC
Start: 2011-12-10 — End: 2012-03-10

## 2011-12-10 MED ORDER — ATORVASTATIN CALCIUM 40 MG PO TABS: 40.0000 mg | ORAL_TABLET | Freq: Every day | ORAL | Status: DC

## 2011-12-26 ENCOUNTER — Other Ambulatory Visit: Payer: Self-pay | Admitting: *Deleted

## 2011-12-26 ENCOUNTER — Telehealth: Payer: Self-pay | Admitting: Internal Medicine

## 2011-12-26 DIAGNOSIS — I2510 Atherosclerotic heart disease of native coronary artery without angina pectoris: Secondary | ICD-10-CM

## 2011-12-26 DIAGNOSIS — E7800 Pure hypercholesterolemia, unspecified: Secondary | ICD-10-CM

## 2011-12-26 MED ORDER — EZETIMIBE 10 MG PO TABS: 10.0000 mg | ORAL_TABLET | Freq: Every day | ORAL | Status: DC

## 2011-12-26 NOTE — Telephone Encounter (Signed)
Error. It was filled on 7/22

## 2011-12-26 NOTE — Telephone Encounter (Signed)
Done

## 2012-02-03 ENCOUNTER — Other Ambulatory Visit: Payer: Self-pay | Admitting: Family Medicine

## 2012-02-22 ENCOUNTER — Other Ambulatory Visit: Payer: Self-pay | Admitting: Family Medicine

## 2012-03-10 ENCOUNTER — Telehealth: Payer: Self-pay | Admitting: Family Medicine

## 2012-03-10 DIAGNOSIS — E7800 Pure hypercholesterolemia, unspecified: Secondary | ICD-10-CM

## 2012-03-10 DIAGNOSIS — I2510 Atherosclerotic heart disease of native coronary artery without angina pectoris: Secondary | ICD-10-CM

## 2012-03-10 MED ORDER — NIACIN ER (ANTIHYPERLIPIDEMIC) 500 MG PO TBCR: 500.0000 mg | EXTENDED_RELEASE_TABLET | Freq: Every day | ORAL | Status: DC

## 2012-03-10 NOTE — Telephone Encounter (Signed)
Done

## 2012-04-07 ENCOUNTER — Telehealth: Payer: Self-pay | Admitting: *Deleted

## 2012-04-07 ENCOUNTER — Other Ambulatory Visit: Payer: PRIVATE HEALTH INSURANCE

## 2012-04-07 DIAGNOSIS — Z125 Encounter for screening for malignant neoplasm of prostate: Secondary | ICD-10-CM

## 2012-04-07 DIAGNOSIS — Z79899 Other long term (current) drug therapy: Secondary | ICD-10-CM

## 2012-04-07 NOTE — Telephone Encounter (Signed)
CBC, c-met, PSA and TSH Dx v58.69 (not due for lipids)

## 2012-04-07 NOTE — Telephone Encounter (Signed)
Patient is scheduled for CPE this coming Wed, 04/09/12-he was scheduled for labs today for the CPE but nothing was ever sent to you to order the lab work. Can you please let me know what labs need to be ordered, his blood was drawn this am, thanks.

## 2012-04-07 NOTE — Telephone Encounter (Signed)
V76.44 for the PSA

## 2012-04-08 LAB — COMPREHENSIVE METABOLIC PANEL
AST: 24 U/L (ref 0–37)
BUN: 20 mg/dL (ref 6–23)
Calcium: 9.1 mg/dL (ref 8.4–10.5)
Chloride: 104 mEq/L (ref 96–112)
Creat: 0.84 mg/dL (ref 0.50–1.35)
Total Bilirubin: 0.9 mg/dL (ref 0.3–1.2)

## 2012-04-08 LAB — CBC WITH DIFFERENTIAL/PLATELET
Basophils Absolute: 0 10*3/uL (ref 0.0–0.1)
Eosinophils Absolute: 0.3 10*3/uL (ref 0.0–0.7)
Eosinophils Relative: 6 % — ABNORMAL HIGH (ref 0–5)
HCT: 40.3 % (ref 39.0–52.0)
Lymphocytes Relative: 19 % (ref 12–46)
MCH: 30.7 pg (ref 26.0–34.0)
MCHC: 33.7 g/dL (ref 30.0–36.0)
MCV: 91 fL (ref 78.0–100.0)
Monocytes Absolute: 0.6 10*3/uL (ref 0.1–1.0)
Platelets: 181 10*3/uL (ref 150–400)
RDW: 13.5 % (ref 11.5–15.5)
WBC: 5.6 10*3/uL (ref 4.0–10.5)

## 2012-04-08 LAB — PSA: PSA: 1.77 ng/mL (ref <=4.00)

## 2012-04-09 ENCOUNTER — Ambulatory Visit (INDEPENDENT_AMBULATORY_CARE_PROVIDER_SITE_OTHER): Payer: PRIVATE HEALTH INSURANCE | Admitting: Family Medicine

## 2012-04-09 ENCOUNTER — Other Ambulatory Visit: Payer: Self-pay | Admitting: *Deleted

## 2012-04-09 ENCOUNTER — Encounter: Payer: Self-pay | Admitting: Family Medicine

## 2012-04-09 VITALS — BP 130/86 | HR 48 | Ht 71.0 in | Wt 208.0 lb

## 2012-04-09 DIAGNOSIS — R7301 Impaired fasting glucose: Secondary | ICD-10-CM

## 2012-04-09 DIAGNOSIS — E7800 Pure hypercholesterolemia, unspecified: Secondary | ICD-10-CM

## 2012-04-09 DIAGNOSIS — I10 Essential (primary) hypertension: Secondary | ICD-10-CM

## 2012-04-09 DIAGNOSIS — I2510 Atherosclerotic heart disease of native coronary artery without angina pectoris: Secondary | ICD-10-CM

## 2012-04-09 DIAGNOSIS — Z79899 Other long term (current) drug therapy: Secondary | ICD-10-CM

## 2012-04-09 DIAGNOSIS — Z0000 Encounter for general adult medical examination without abnormal findings: Secondary | ICD-10-CM

## 2012-04-09 LAB — POCT URINALYSIS DIPSTICK
Glucose, UA: NEGATIVE
Ketones, UA: NEGATIVE
Leukocytes, UA: NEGATIVE
Protein, UA: NEGATIVE
Urobilinogen, UA: NEGATIVE

## 2012-04-09 LAB — POCT GLYCOSYLATED HEMOGLOBIN (HGB A1C): Hemoglobin A1C: 5.3

## 2012-04-09 MED ORDER — EZETIMIBE-ATORVASTATIN 10-40 MG PO TABS: 1.0000 | ORAL_TABLET | Freq: Every day | ORAL | Status: DC
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
## 2012-04-09 NOTE — Patient Instructions (Signed)
HEALTH MAINTENANCE RECOMMENDATIONS:  It is recommended that you get at least 30 minutes of aerobic exercise at least 5 days/week (for weight loss, you may need as much as 60-90 minutes). This can be any activity that gets your heart rate up. This can be divided in 10-15 minute intervals if needed, but try and build up your endurance at least once a week.  Weight bearing exercise is also recommended twice weekly.  Eat a healthy diet with lots of vegetables, fruits and fiber.  "Colorful" foods have a lot of vitamins (ie green vegetables, tomatoes, red peppers, etc).  Limit sweet tea, regular sodas and alcoholic beverages, all of which has a lot of calories and sugar.  Up to 2 alcoholic drinks daily may be beneficial for men (unless trying to lose weight, watch sugars).  Drink a lot of water.  Sunscreen of at least SPF 30 should be used on all sun-exposed parts of the skin when outside between the hours of 10 am and 4 pm (not just when at beach or pool, but even with exercise, golf, tennis, and yard work!)  Use a sunscreen that says "broad spectrum" so it covers both UVA and UVB rays, and make sure to reapply every 1-2 hours.  Remember to change the batteries in your smoke detectors when changing your clock times in the spring and fall.  Use your seat belt every time you are in a car, and please drive safely and not be distracted with cell phones and texting while driving.    Lab Results  Component Value Date   WBC 5.6 04/07/2012   HGB 13.6 04/07/2012   HCT 40.3 04/07/2012   MCV 91.0 04/07/2012   PLT 181 04/07/2012     Chemistry      Component Value Date/Time   NA 142 04/07/2012 1406   K 4.6 04/07/2012 1406   CL 104 04/07/2012 1406   CO2 30 04/07/2012 1406   BUN 20 04/07/2012 1406   CREATININE 0.84 04/07/2012 1406      Component Value Date/Time   CALCIUM 9.1 04/07/2012 1406   ALKPHOS 64 04/07/2012 1406   AST 24 04/07/2012 1406   ALT 28 04/07/2012 1406   BILITOT 0.9 04/07/2012 1406       Glucose 106 (normal <100) Lab Results  Component Value Date   PSA 1.77 04/07/2012   PSA 1.69 01/10/2011   Lab Results  Component Value Date   TSH 3.122 04/07/2012   Lab Results  Component Value Date   CHOL 112 12/06/2011   HDL 45 12/06/2011   LDLCALC 54 12/06/2011   TRIG 63 12/06/2011   CHOLHDL 2.5 12/06/2011

## 2012-05-04 ENCOUNTER — Other Ambulatory Visit: Payer: Self-pay | Admitting: Family Medicine

## 2012-05-05 ENCOUNTER — Other Ambulatory Visit: Payer: Self-pay | Admitting: Family Medicine

## 2012-05-06 ENCOUNTER — Other Ambulatory Visit: Payer: Self-pay | Admitting: Family Medicine

## 2012-05-06 DIAGNOSIS — N529 Male erectile dysfunction, unspecified: Secondary | ICD-10-CM

## 2012-05-07 NOTE — Telephone Encounter (Signed)
done

## 2012-05-07 NOTE — Telephone Encounter (Signed)
Is this okay to refill? 

## 2012-06-04 ENCOUNTER — Other Ambulatory Visit: Payer: Self-pay | Admitting: *Deleted

## 2012-06-04 DIAGNOSIS — E785 Hyperlipidemia, unspecified: Secondary | ICD-10-CM

## 2012-06-04 MED ORDER — EZETIMIBE-ATORVASTATIN 10-40 MG PO TABS: 1.0000 | ORAL_TABLET | Freq: Every day | ORAL | Status: DC

## 2012-07-05 ENCOUNTER — Other Ambulatory Visit: Payer: Self-pay

## 2012-07-08 ENCOUNTER — Other Ambulatory Visit: Payer: PRIVATE HEALTH INSURANCE

## 2012-07-08 DIAGNOSIS — R7301 Impaired fasting glucose: Secondary | ICD-10-CM

## 2012-07-08 DIAGNOSIS — Z79899 Other long term (current) drug therapy: Secondary | ICD-10-CM

## 2012-07-09 LAB — HEPATIC FUNCTION PANEL
Albumin: 4.5 g/dL (ref 3.5–5.2)
Alkaline Phosphatase: 63 U/L (ref 39–117)
Total Bilirubin: 0.8 mg/dL (ref 0.3–1.2)

## 2012-07-09 LAB — LIPID PANEL
HDL: 52 mg/dL (ref >39)
LDL Cholesterol: 60 mg/dL (ref 0–99)
Total CHOL/HDL Ratio: 2.4 Ratio
VLDL: 13 mg/dL (ref 0–40)

## 2012-07-09 LAB — GLUCOSE, RANDOM: Glucose, Bld: 105 mg/dL — ABNORMAL HIGH (ref 70–99)

## 2012-07-11 ENCOUNTER — Other Ambulatory Visit: Payer: Self-pay | Admitting: Family Medicine

## 2012-08-11 ENCOUNTER — Telehealth: Payer: Self-pay | Admitting: Family Medicine

## 2012-08-11 DIAGNOSIS — E785 Hyperlipidemia, unspecified: Secondary | ICD-10-CM

## 2012-08-11 DIAGNOSIS — E7800 Pure hypercholesterolemia, unspecified: Secondary | ICD-10-CM

## 2012-08-11 DIAGNOSIS — I2510 Atherosclerotic heart disease of native coronary artery without angina pectoris: Secondary | ICD-10-CM

## 2012-08-11 MED ORDER — EZETIMIBE 10 MG PO TABS: 10.0000 mg | ORAL_TABLET | Freq: Every day | ORAL | Status: DC

## 2012-08-11 MED ORDER — ATORVASTATIN CALCIUM 40 MG PO TABS: 40.0000 mg | ORAL_TABLET | Freq: Every day | ORAL | Status: DC

## 2012-08-11 NOTE — Telephone Encounter (Signed)
Called and left message for pt to give me a call back-- wanting to let pt know that i called atorvastatin into pharmacy and have zetia 10mg  samples for him and will give to Dr. Lynelle Doctor for her to give to him tonight.

## 2012-08-11 NOTE — Telephone Encounter (Signed)
I only see 10/10 and 10/20 samples.  He is on 10/40.  Please look to see if we have any Zetia samples, and if so, give a month and phone in atorvastatin 40mg  to pharmacy.  If no samples, check with pharmacy to see if liptruzet is available, and if not, then will need separate atorvastatin and zetia rx's.  Please advise pt today when done--if any samples, I can bring them to him tonight (give to me in bag).

## 2012-09-08 ENCOUNTER — Telehealth: Payer: Self-pay | Admitting: Internal Medicine

## 2012-09-08 ENCOUNTER — Encounter: Payer: Self-pay | Admitting: *Deleted

## 2012-09-08 DIAGNOSIS — E7800 Pure hypercholesterolemia, unspecified: Secondary | ICD-10-CM

## 2012-09-08 DIAGNOSIS — R7301 Impaired fasting glucose: Secondary | ICD-10-CM

## 2012-09-08 DIAGNOSIS — I2510 Atherosclerotic heart disease of native coronary artery without angina pectoris: Secondary | ICD-10-CM

## 2012-09-08 DIAGNOSIS — Z79899 Other long term (current) drug therapy: Secondary | ICD-10-CM

## 2012-09-08 MED ORDER — ATORVASTATIN CALCIUM 40 MG PO TABS: 40.0000 mg | ORAL_TABLET | Freq: Every day | ORAL | Status: DC

## 2012-09-08 MED ORDER — EZETIMIBE 10 MG PO TABS: 10.0000 mg | ORAL_TABLET | Freq: Every day | ORAL | Status: DC

## 2012-09-08 MED ORDER — NIACIN ER (ANTIHYPERLIPIDEMIC) 500 MG PO TBCR: 500.0000 mg | EXTENDED_RELEASE_TABLET | Freq: Every day | ORAL | Status: DC

## 2012-09-08 NOTE — Telephone Encounter (Signed)
Refills called in and patient will call to schedule med check appointment after he checks his schedule. He is due around 10/07/12 but [place]. He will schedule once he returns.

## 2012-09-08 NOTE — Telephone Encounter (Signed)
Chart reviewed.  6 month med check is/was due in May (6 months from last physical)--this was mainly for A1c (for IFG) and f/u on his blood pressure.  Lipids and LFT's not due until August (since had labs done again in February).  At this point, this is what I recommend:  Splitting the difference, and returning for med check mid-end of July rather than June.  Do A1c, c-met, lipids prior to visit (dx IFG, 272.0, v58.69) prior to visit.  Please enter future orders and reschedule his appt.  He needs to have his BP checked periodically, and if BP's are consistently >135-140/85-90 then needs to return sooner. Otherwise, come in July.  Remind him that he was also supposed to be checking on insurance coverage of zostavax prior to his next appt (so that we can give vaccine at visit).  He was also to look into date/type of tetanus for [place] to enter in computer immunization record.

## 2012-09-08 NOTE — Telephone Encounter (Signed)
i have scheduled pt on Wednesday June 18 @ 3:30 pm for med check,. He was limited on certain days he could come in,  however you have 2 med check and 1 physical on this day, i didn't know if you would be okay with do a med check for him  on that day. Will he need labs?

## 2012-09-08 NOTE — Telephone Encounter (Signed)
Refill request for niaspan ER 500mg  #30 , atorvastatin 40mg  #30 to [facility] [address]

## 2012-09-08 NOTE — Telephone Encounter (Signed)
The 2 med checks and physical is in afternoon.

## 2012-09-09 ENCOUNTER — Encounter: Payer: Self-pay | Admitting: Internal Medicine

## 2012-09-09 NOTE — Telephone Encounter (Signed)
Pt is scheduled to come back in thurs July 17th for labs and then wed July 23 for appt. i put orders in the computer. Pt seems like he was in a hurry to get off phone,  so i was not able to tell him about checking his BP or checking on insurance coverage for zostavax so i sent a letter to him saying all that by mail.

## 2012-09-22 ENCOUNTER — Other Ambulatory Visit: Payer: Self-pay | Admitting: *Deleted

## 2012-09-22 DIAGNOSIS — E7800 Pure hypercholesterolemia, unspecified: Secondary | ICD-10-CM

## 2012-09-22 DIAGNOSIS — I2510 Atherosclerotic heart disease of native coronary artery without angina pectoris: Secondary | ICD-10-CM

## 2012-09-22 MED ORDER — EZETIMIBE 10 MG PO TABS: 10.0000 mg | ORAL_TABLET | Freq: Every day | ORAL | Status: DC

## 2012-10-15 ENCOUNTER — Telehealth: Payer: Self-pay | Admitting: Family Medicine

## 2012-10-15 DIAGNOSIS — I2510 Atherosclerotic heart disease of native coronary artery without angina pectoris: Secondary | ICD-10-CM

## 2012-10-15 DIAGNOSIS — E7800 Pure hypercholesterolemia, unspecified: Secondary | ICD-10-CM

## 2012-10-15 MED ORDER — ATORVASTATIN CALCIUM 40 MG PO TABS: 40.0000 mg | ORAL_TABLET | Freq: Every day | ORAL | Status: DC

## 2012-10-15 NOTE — Telephone Encounter (Signed)
Pt called and states need refill on Lipitor 40 mg to [facility]   Pt [phone]

## 2012-10-15 NOTE — Telephone Encounter (Signed)
done

## 2012-10-25 ENCOUNTER — Other Ambulatory Visit: Payer: Self-pay | Admitting: Family Medicine

## 2012-10-29 ENCOUNTER — Other Ambulatory Visit: Payer: Self-pay | Admitting: *Deleted

## 2012-10-29 DIAGNOSIS — E7800 Pure hypercholesterolemia, unspecified: Secondary | ICD-10-CM

## 2012-10-29 DIAGNOSIS — I2510 Atherosclerotic heart disease of native coronary artery without angina pectoris: Secondary | ICD-10-CM

## 2012-10-29 MED ORDER — EZETIMIBE 10 MG PO TABS: 10.0000 mg | ORAL_TABLET | Freq: Every day | ORAL | Status: DC

## 2012-10-30 ENCOUNTER — Ambulatory Visit: Payer: Self-pay | Admitting: Family Medicine

## 2012-11-05 ENCOUNTER — Encounter: Payer: Self-pay | Admitting: Family Medicine

## 2012-11-09 ENCOUNTER — Other Ambulatory Visit: Payer: Self-pay | Admitting: Family Medicine

## 2012-11-14 ENCOUNTER — Telehealth: Payer: Self-pay | Admitting: Internal Medicine

## 2012-11-14 DIAGNOSIS — E7800 Pure hypercholesterolemia, unspecified: Secondary | ICD-10-CM

## 2012-11-14 DIAGNOSIS — I2510 Atherosclerotic heart disease of native coronary artery without angina pectoris: Secondary | ICD-10-CM

## 2012-11-14 MED ORDER — EZETIMIBE 10 MG PO TABS: 10.0000 mg | ORAL_TABLET | Freq: Every day | ORAL | Status: DC

## 2012-11-14 NOTE — Telephone Encounter (Signed)
Pt needs samples of zetia

## 2012-11-24 ENCOUNTER — Other Ambulatory Visit: Payer: Self-pay | Admitting: Family Medicine

## 2012-12-04 ENCOUNTER — Other Ambulatory Visit: Payer: PRIVATE HEALTH INSURANCE

## 2012-12-04 ENCOUNTER — Encounter: Payer: Self-pay | Admitting: Family Medicine

## 2012-12-04 DIAGNOSIS — E7800 Pure hypercholesterolemia, unspecified: Secondary | ICD-10-CM

## 2012-12-04 DIAGNOSIS — Z79899 Other long term (current) drug therapy: Secondary | ICD-10-CM

## 2012-12-04 DIAGNOSIS — R7301 Impaired fasting glucose: Secondary | ICD-10-CM

## 2012-12-04 LAB — LIPID PANEL
Cholesterol: 117 mg/dL (ref 0–200)
LDL Cholesterol: 61 mg/dL (ref 0–99)
Total CHOL/HDL Ratio: 2.9 Ratio
Triglycerides: 74 mg/dL (ref <150)
VLDL: 15 mg/dL (ref 0–40)

## 2012-12-04 LAB — COMPREHENSIVE METABOLIC PANEL
ALT: 34 U/L (ref 0–53)
AST: 27 U/L (ref 0–37)
Alkaline Phosphatase: 62 U/L (ref 39–117)
BUN: 15 mg/dL (ref 6–23)
Creat: 0.88 mg/dL (ref 0.50–1.35)
Total Bilirubin: 0.9 mg/dL (ref 0.3–1.2)

## 2012-12-05 ENCOUNTER — Other Ambulatory Visit: Payer: Self-pay | Admitting: Family Medicine

## 2012-12-10 ENCOUNTER — Ambulatory Visit (INDEPENDENT_AMBULATORY_CARE_PROVIDER_SITE_OTHER): Payer: PRIVATE HEALTH INSURANCE | Admitting: Family Medicine

## 2012-12-10 ENCOUNTER — Encounter: Payer: Self-pay | Admitting: Family Medicine

## 2012-12-10 VITALS — BP 130/80 | HR 72 | Ht 71.0 in | Wt 215.0 lb

## 2012-12-10 DIAGNOSIS — Z125 Encounter for screening for malignant neoplasm of prostate: Secondary | ICD-10-CM

## 2012-12-10 DIAGNOSIS — Z2911 Encounter for prophylactic immunotherapy for respiratory syncytial virus (RSV): Secondary | ICD-10-CM

## 2012-12-10 DIAGNOSIS — E7800 Pure hypercholesterolemia, unspecified: Secondary | ICD-10-CM

## 2012-12-10 DIAGNOSIS — I2510 Atherosclerotic heart disease of native coronary artery without angina pectoris: Secondary | ICD-10-CM

## 2012-12-10 DIAGNOSIS — Z23 Encounter for immunization: Secondary | ICD-10-CM

## 2012-12-10 DIAGNOSIS — Z79899 Other long term (current) drug therapy: Secondary | ICD-10-CM

## 2012-12-10 DIAGNOSIS — R7301 Impaired fasting glucose: Secondary | ICD-10-CM

## 2012-12-10 DIAGNOSIS — I10 Essential (primary) hypertension: Secondary | ICD-10-CM

## 2012-12-10 NOTE — Patient Instructions (Addendum)
Continue all current medications.  We can consider stopping the niaspan and rechecking particle size, but for now, let's continue.  Hopefully blood sugars will be back down to <100 with weight loss.  Continue to exercise daily, try and lose some inches at the waist.
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
## 2012-12-29 ENCOUNTER — Other Ambulatory Visit: Payer: Self-pay | Admitting: Family Medicine

## 2013-01-13 ENCOUNTER — Telehealth: Payer: Self-pay | Admitting: Family Medicine

## 2013-01-13 NOTE — Telephone Encounter (Signed)
Pt called concerning [place] and Lipitor Rx's,. He has been having issues with [facility] concerning getting these filled. I informed patent that we refilled these prescriptions on 12/30/12. Informed him I would call [facility] to verify that have Rx's. Per [facility] they have Rx's in system, they were not filled she stated it was too early when they came in, asked her to get them ready the patient is needing to pick them up

## 2013-01-18 ENCOUNTER — Other Ambulatory Visit: Payer: Self-pay | Admitting: Family Medicine

## 2013-01-25 ENCOUNTER — Other Ambulatory Visit: Payer: Self-pay | Admitting: Family Medicine

## 2013-02-22 ENCOUNTER — Other Ambulatory Visit: Payer: Self-pay | Admitting: Family Medicine

## 2013-03-26 ENCOUNTER — Other Ambulatory Visit: Payer: Self-pay

## 2013-03-28 ENCOUNTER — Telehealth: Payer: Self-pay | Admitting: Family Medicine

## 2013-03-28 DIAGNOSIS — I10 Essential (primary) hypertension: Secondary | ICD-10-CM

## 2013-03-28 MED ORDER — AMLODIPINE BESYLATE 5 MG PO TABS: ORAL_TABLET | ORAL | Status: DC

## 2013-03-28 NOTE — Telephone Encounter (Signed)
Done.  Pt stated he would be contacting [facility]--it appears that we sent rx 10/5 for #90 there

## 2013-04-01 ENCOUNTER — Other Ambulatory Visit: Payer: Self-pay | Admitting: *Deleted

## 2013-04-01 ENCOUNTER — Telehealth: Payer: Self-pay | Admitting: Family Medicine

## 2013-04-01 DIAGNOSIS — I10 Essential (primary) hypertension: Secondary | ICD-10-CM

## 2013-04-01 MED ORDER — AMLODIPINE BESYLATE 5 MG PO TABS: ORAL_TABLET | ORAL | Status: DC

## 2013-04-01 NOTE — Telephone Encounter (Signed)
Done

## 2013-04-01 NOTE — Telephone Encounter (Signed)
[facility] req refill on Amlodipine 5 mg tab 90 day supply

## 2013-04-06 ENCOUNTER — Other Ambulatory Visit: Payer: Self-pay | Admitting: *Deleted

## 2013-04-06 ENCOUNTER — Telehealth: Payer: Self-pay | Admitting: Internal Medicine

## 2013-04-06 DIAGNOSIS — I10 Essential (primary) hypertension: Secondary | ICD-10-CM

## 2013-04-06 MED ORDER — AMLODIPINE BESYLATE 5 MG PO TABS: ORAL_TABLET | ORAL | Status: DC

## 2013-04-06 NOTE — Telephone Encounter (Signed)
Refill request for Amlodipine 5mg #[facility] to [facility]  

## 2013-04-06 NOTE — Telephone Encounter (Signed)
Refill request for Amlodipine 5mg  [facility]

## 2013-04-06 NOTE — Telephone Encounter (Signed)
Done

## 2013-04-07 MED ORDER — AMLODIPINE BESYLATE 5 MG PO TABS: ORAL_TABLET | ORAL | Status: DC

## 2013-04-07 NOTE — Telephone Encounter (Signed)
done

## 2013-04-28 ENCOUNTER — Other Ambulatory Visit: Payer: Self-pay | Admitting: Family Medicine

## 2013-04-29 ENCOUNTER — Other Ambulatory Visit: Payer: PRIVATE HEALTH INSURANCE

## 2013-06-08 ENCOUNTER — Other Ambulatory Visit: Payer: PRIVATE HEALTH INSURANCE

## 2013-06-08 DIAGNOSIS — I10 Essential (primary) hypertension: Secondary | ICD-10-CM

## 2013-06-08 DIAGNOSIS — Z125 Encounter for screening for malignant neoplasm of prostate: Secondary | ICD-10-CM

## 2013-06-08 DIAGNOSIS — R7301 Impaired fasting glucose: Secondary | ICD-10-CM

## 2013-06-08 DIAGNOSIS — Z79899 Other long term (current) drug therapy: Secondary | ICD-10-CM

## 2013-06-08 DIAGNOSIS — E7800 Pure hypercholesterolemia, unspecified: Secondary | ICD-10-CM

## 2013-06-08 LAB — CBC WITH DIFFERENTIAL/PLATELET
BASOS ABS: 0 10*3/uL (ref 0.0–0.1)
Basophils Relative: 0 % (ref 0–1)
Eosinophils Absolute: 0.3 10*3/uL (ref 0.0–0.7)
Eosinophils Relative: 5 % (ref 0–5)
HCT: 41 % (ref 39.0–52.0)
Hemoglobin: 14.1 g/dL (ref 13.0–17.0)
Lymphocytes Relative: 16 % (ref 12–46)
Lymphs Abs: 1.1 10*3/uL (ref 0.7–4.0)
MCH: 30.9 pg (ref 26.0–34.0)
MCHC: 34.4 g/dL (ref 30.0–36.0)
MCV: 89.9 fL (ref 78.0–100.0)
MONO ABS: 0.8 10*3/uL (ref 0.1–1.0)
Monocytes Relative: 12 % (ref 3–12)
NEUTROS ABS: 4.3 10*3/uL (ref 1.7–7.7)
Neutrophils Relative %: 67 % (ref 43–77)
Platelets: 194 10*3/uL (ref 150–400)
RBC: 4.56 MIL/uL (ref 4.22–5.81)
RDW: 13 % (ref 11.5–15.5)
WBC: 6.5 10*3/uL (ref 4.0–10.5)

## 2013-06-08 LAB — COMPREHENSIVE METABOLIC PANEL
ALT: 32 U/L (ref 0–53)
AST: 26 U/L (ref 0–37)
Albumin: 4.5 g/dL (ref 3.5–5.2)
Alkaline Phosphatase: 62 U/L (ref 39–117)
BILIRUBIN TOTAL: 1 mg/dL (ref 0.3–1.2)
BUN: 19 mg/dL (ref 6–23)
CO2: 30 meq/L (ref 19–32)
Calcium: 9 mg/dL (ref 8.4–10.5)
Chloride: 102 mEq/L (ref 96–112)
Creat: 0.93 mg/dL (ref 0.50–1.35)
Glucose, Bld: 116 mg/dL — ABNORMAL HIGH (ref 70–99)
POTASSIUM: 3.9 meq/L (ref 3.5–5.3)
SODIUM: 139 meq/L (ref 135–145)
TOTAL PROTEIN: 6 g/dL (ref 6.0–8.3)

## 2013-06-08 LAB — LIPID PANEL
CHOL/HDL RATIO: 2.8 ratio
Cholesterol: 125 mg/dL (ref 0–200)
HDL: 45 mg/dL (ref >39)
LDL Cholesterol: 62 mg/dL (ref 0–99)
Triglycerides: 90 mg/dL (ref <150)
VLDL: 18 mg/dL (ref 0–40)

## 2013-06-09 LAB — PSA: PSA: 2.3 ng/mL (ref <=4.00)

## 2013-06-09 LAB — TSH: TSH: 3.084 u[IU]/mL (ref 0.350–4.500)

## 2013-06-10 ENCOUNTER — Ambulatory Visit (INDEPENDENT_AMBULATORY_CARE_PROVIDER_SITE_OTHER): Payer: PRIVATE HEALTH INSURANCE | Admitting: Family Medicine

## 2013-06-10 ENCOUNTER — Encounter: Payer: Self-pay | Admitting: Family Medicine

## 2013-06-10 VITALS — BP 120/78 | HR 56 | Ht 71.0 in | Wt 219.0 lb

## 2013-06-10 DIAGNOSIS — Z79899 Other long term (current) drug therapy: Secondary | ICD-10-CM

## 2013-06-10 DIAGNOSIS — N529 Male erectile dysfunction, unspecified: Secondary | ICD-10-CM

## 2013-06-10 DIAGNOSIS — E785 Hyperlipidemia, unspecified: Secondary | ICD-10-CM

## 2013-06-10 DIAGNOSIS — Z0000 Encounter for general adult medical examination without abnormal findings: Secondary | ICD-10-CM

## 2013-06-10 DIAGNOSIS — E7800 Pure hypercholesterolemia, unspecified: Secondary | ICD-10-CM

## 2013-06-10 DIAGNOSIS — R7301 Impaired fasting glucose: Secondary | ICD-10-CM

## 2013-06-10 DIAGNOSIS — E66811 Obesity, class 1: Secondary | ICD-10-CM

## 2013-06-10 DIAGNOSIS — I2510 Atherosclerotic heart disease of native coronary artery without angina pectoris: Secondary | ICD-10-CM

## 2013-06-10 DIAGNOSIS — E669 Obesity, unspecified: Secondary | ICD-10-CM

## 2013-06-10 DIAGNOSIS — I10 Essential (primary) hypertension: Secondary | ICD-10-CM

## 2013-06-10 LAB — POCT URINALYSIS DIPSTICK
Bilirubin, UA: NEGATIVE
Glucose, UA: NEGATIVE
Ketones, UA: NEGATIVE
NITRITE UA: NEGATIVE
Protein, UA: NEGATIVE
Spec Grav, UA: <=1.005
UROBILINOGEN UA: NEGATIVE
pH, UA: 6

## 2013-06-10 LAB — POCT GLYCOSYLATED HEMOGLOBIN (HGB A1C): Hemoglobin A1C: 5.4

## 2013-06-10 MED ORDER — LISINOPRIL-HYDROCHLOROTHIAZIDE 20-12.5 MG PO TABS: 0.5000 | ORAL_TABLET | Freq: Every day | ORAL | Status: DC

## 2013-06-10 MED ORDER — PINDOLOL 5 MG PO TABS: ORAL_TABLET | ORAL | Status: DC

## 2013-06-10 MED ORDER — NIACIN ER (ANTIHYPERLIPIDEMIC) 500 MG PO TBCR: 500.0000 mg | EXTENDED_RELEASE_TABLET | Freq: Every day | ORAL | Status: DC

## 2013-06-10 MED ORDER — SILDENAFIL CITRATE 100 MG PO TABS: ORAL_TABLET | ORAL | Status: DC

## 2013-06-10 MED ORDER — AMLODIPINE BESYLATE 5 MG PO TABS: ORAL_TABLET | ORAL | Status: DC

## 2013-06-10 MED ORDER — EZETIMIBE-ATORVASTATIN 10-40 MG PO TABS: 1.0000 | ORAL_TABLET | Freq: Every day | ORAL | Status: DC

## 2013-06-10 NOTE — Patient Instructions (Signed)
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
## 2013-07-09 ENCOUNTER — Encounter: Payer: Self-pay | Admitting: Cardiovascular Disease

## 2013-07-09 ENCOUNTER — Ambulatory Visit (INDEPENDENT_AMBULATORY_CARE_PROVIDER_SITE_OTHER): Payer: PRIVATE HEALTH INSURANCE | Admitting: Cardiovascular Disease

## 2013-07-09 VITALS — BP 136/80 | HR 53 | Ht 72.0 in | Wt 224.9 lb

## 2013-07-09 DIAGNOSIS — E7800 Pure hypercholesterolemia, unspecified: Secondary | ICD-10-CM

## 2013-07-09 DIAGNOSIS — I2510 Atherosclerotic heart disease of native coronary artery without angina pectoris: Secondary | ICD-10-CM

## 2013-07-09 DIAGNOSIS — E669 Obesity, unspecified: Secondary | ICD-10-CM

## 2013-07-09 DIAGNOSIS — I10 Essential (primary) hypertension: Secondary | ICD-10-CM

## 2013-07-09 DIAGNOSIS — R7301 Impaired fasting glucose: Secondary | ICD-10-CM

## 2013-07-09 NOTE — Patient Instructions (Addendum)
Your physician recommends that you schedule a follow-up appointment in 1 year. No changes were made in your therapy today.  The requested test will be ordered once you call us with insurance coverage.

## 2013-07-11 ENCOUNTER — Encounter: Payer: Self-pay | Admitting: Cardiovascular Disease
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
## 2013-07-17 ENCOUNTER — Other Ambulatory Visit: Payer: Self-pay | Admitting: Family Medicine

## 2013-07-29 ENCOUNTER — Other Ambulatory Visit: Payer: Self-pay | Admitting: Family Medicine

## 2013-08-31 ENCOUNTER — Encounter: Payer: Self-pay | Admitting: Family Medicine

## 2013-08-31 ENCOUNTER — Other Ambulatory Visit: Payer: Self-pay | Admitting: Family Medicine

## 2013-08-31 DIAGNOSIS — M5430 Sciatica, unspecified side: Secondary | ICD-10-CM

## 2013-08-31 MED ORDER — METHYLPREDNISOLONE (PAK) 4 MG PO TABS: ORAL_TABLET | ORAL | Status: DC

## 2013-10-16 ENCOUNTER — Other Ambulatory Visit: Payer: Self-pay | Admitting: Family Medicine

## 2013-10-23 DIAGNOSIS — L819 Disorder of pigmentation, unspecified: Secondary | ICD-10-CM | POA: Diagnosis not present

## 2013-10-23 DIAGNOSIS — L821 Other seborrheic keratosis: Secondary | ICD-10-CM | POA: Diagnosis not present

## 2013-11-17 ENCOUNTER — Other Ambulatory Visit: Payer: Self-pay | Admitting: Family Medicine

## 2013-12-01 ENCOUNTER — Telehealth: Payer: Self-pay | Admitting: Internal Medicine

## 2013-12-01 DIAGNOSIS — I10 Essential (primary) hypertension: Secondary | ICD-10-CM

## 2013-12-01 MED ORDER — PINDOLOL 5 MG PO TABS: ORAL_TABLET | ORAL | Status: DC

## 2013-12-01 NOTE — Telephone Encounter (Signed)
Refill request for pindolol to [facility]

## 2013-12-01 NOTE — Telephone Encounter (Signed)
Medication called in 

## 2013-12-03 ENCOUNTER — Other Ambulatory Visit: Payer: Medicare Other

## 2013-12-03 DIAGNOSIS — E785 Hyperlipidemia, unspecified: Secondary | ICD-10-CM

## 2013-12-03 DIAGNOSIS — R7301 Impaired fasting glucose: Secondary | ICD-10-CM

## 2013-12-03 DIAGNOSIS — Z79899 Other long term (current) drug therapy: Secondary | ICD-10-CM | POA: Diagnosis not present

## 2013-12-03 LAB — HEPATIC FUNCTION PANEL
ALBUMIN: 4.1 g/dL (ref 3.5–5.2)
ALK PHOS: 68 U/L (ref 39–117)
ALT: 33 U/L (ref 0–53)
AST: 26 U/L (ref 0–37)
Bilirubin, Direct: 0.2 mg/dL (ref 0.0–0.3)
Indirect Bilirubin: 0.7 mg/dL (ref 0.2–1.2)
TOTAL PROTEIN: 5.7 g/dL — AB (ref 6.0–8.3)
Total Bilirubin: 0.9 mg/dL (ref 0.2–1.2)

## 2013-12-03 LAB — LIPID PANEL
CHOL/HDL RATIO: 2.2 ratio
Cholesterol: 112 mg/dL (ref 0–200)
HDL: 50 mg/dL (ref >39)
LDL Cholesterol: 50 mg/dL (ref 0–99)
TRIGLYCERIDES: 58 mg/dL (ref <150)
VLDL: 12 mg/dL (ref 0–40)

## 2013-12-03 LAB — GLUCOSE, RANDOM: Glucose, Bld: 113 mg/dL — ABNORMAL HIGH (ref 70–99)

## 2013-12-04 ENCOUNTER — Encounter: Payer: Self-pay | Admitting: Family Medicine

## 2013-12-04 ENCOUNTER — Telehealth: Payer: Self-pay

## 2013-12-04 DIAGNOSIS — I10 Essential (primary) hypertension: Secondary | ICD-10-CM

## 2013-12-04 DIAGNOSIS — E7800 Pure hypercholesterolemia, unspecified: Secondary | ICD-10-CM

## 2013-12-04 NOTE — Telephone Encounter (Signed)
Refill request for atorvastatin and Amlodipine sent to [facility]

## 2013-12-05 MED ORDER — AMLODIPINE BESYLATE 5 MG PO TABS: ORAL_TABLET | ORAL | Status: DC

## 2013-12-05 MED ORDER — ATORVASTATIN CALCIUM 40 MG PO TABS: ORAL_TABLET | ORAL | Status: DC

## 2013-12-05 NOTE — Addendum Note (Signed)
Addended by: Rita Ohara on: 12/05/2013 09:53 AM   Modules accepted: Orders

## 2013-12-05 NOTE — Telephone Encounter (Signed)
done

## 2013-12-21 ENCOUNTER — Other Ambulatory Visit: Payer: Self-pay | Admitting: Family Medicine

## 2014-01-08 ENCOUNTER — Telehealth: Payer: Self-pay | Admitting: Internal Medicine

## 2014-01-08 DIAGNOSIS — E785 Hyperlipidemia, unspecified: Secondary | ICD-10-CM

## 2014-01-08 MED ORDER — EZETIMIBE 10 MG PO TABS: ORAL_TABLET | ORAL | Status: DC

## 2014-01-08 MED ORDER — NIACIN ER (ANTIHYPERLIPIDEMIC) 500 MG PO TBCR: 500.0000 mg | EXTENDED_RELEASE_TABLET | Freq: Every day | ORAL | Status: DC

## 2014-01-08 NOTE — Telephone Encounter (Signed)
Refill request for 90 day supply to [facility]

## 2014-01-11 ENCOUNTER — Telehealth: Payer: Self-pay | Admitting: Family Medicine

## 2014-01-11 ENCOUNTER — Telehealth: Payer: Self-pay | Admitting: Internal Medicine

## 2014-01-11 DIAGNOSIS — I10 Essential (primary) hypertension: Secondary | ICD-10-CM

## 2014-01-11 MED ORDER — LISINOPRIL-HYDROCHLOROTHIAZIDE 20-12.5 MG PO TABS: 0.5000 | ORAL_TABLET | Freq: Every day | ORAL | Status: DC

## 2014-01-11 NOTE — Telephone Encounter (Signed)
Pt called and stated that he has a new mail order pharmacy. He is almost out of medication. He is requesting that a emergency refill to local pharmacy until he gets order in mail. Pt uses [facility]  On [address]

## 2014-01-11 NOTE — Telephone Encounter (Signed)
Spoke with patient and sent rx for #10 of his lisinopril to [facility] per his request. He will call in the next week and schedule OV (see last lab results per Dr.Knapp).

## 2014-01-11 NOTE — Telephone Encounter (Signed)
Refill request for lisinopril-hctz to [facility]

## 2014-01-11 NOTE — Telephone Encounter (Signed)
Done

## 2014-02-08 ENCOUNTER — Other Ambulatory Visit: Payer: Self-pay | Admitting: Family Medicine

## 2014-03-01 ENCOUNTER — Encounter: Payer: Self-pay | Admitting: Family Medicine

## 2014-03-03 ENCOUNTER — Encounter: Payer: Self-pay | Admitting: Family Medicine

## 2014-03-03 ENCOUNTER — Telehealth: Payer: Self-pay | Admitting: *Deleted

## 2014-03-03 NOTE — Telephone Encounter (Signed)
Patient wrote:So far no change. Since I can't sit stand or lay down comfortably, what's your thoughts on letting me try tramadol 50. I did try Salonpas with little change. I won't take narcotics. I just need to be able to function. This is not fun. Need your thoughts ??  Dr.Knapp replied:Salonpas is likely just topical capsaicin. I cannot call in tramadol without eval, and that would only mask the pain, not treat it at all. I would be willing to do a medrol dosepak, vs really what he should have is OV with me. See what he wants to do  Spoke with patient and he said he really doesn't want to take any steroids. He did tell me that the salonpas is methylsalicylate, camphor and menthol. He scheduled an acute visit with Dr. Tomi Bamberger for this coming Friday 03/05/14 @ 2:30pm - he will call to cancel if he begins to feel better, just had PT session and was feeling somewhat better when I spoke to him.

## 2014-03-05 ENCOUNTER — Ambulatory Visit (INDEPENDENT_AMBULATORY_CARE_PROVIDER_SITE_OTHER): Payer: Medicare Other | Admitting: Family Medicine

## 2014-03-05 ENCOUNTER — Encounter: Payer: Self-pay | Admitting: Family Medicine

## 2014-03-05 VITALS — BP 130/84 | HR 56 | Ht 71.0 in | Wt 215.0 lb

## 2014-03-05 DIAGNOSIS — I2510 Atherosclerotic heart disease of native coronary artery without angina pectoris: Secondary | ICD-10-CM | POA: Diagnosis not present

## 2014-03-05 DIAGNOSIS — I10 Essential (primary) hypertension: Secondary | ICD-10-CM

## 2014-03-05 DIAGNOSIS — R7301 Impaired fasting glucose: Secondary | ICD-10-CM | POA: Diagnosis not present

## 2014-03-05 DIAGNOSIS — G5701 Lesion of sciatic nerve, right lower limb: Secondary | ICD-10-CM | POA: Diagnosis not present

## 2014-03-05 LAB — POCT GLYCOSYLATED HEMOGLOBIN (HGB A1C): Hemoglobin A1C: 5.4

## 2014-03-05 MED ORDER — TRAMADOL HCL 50 MG PO TABS: 50.0000 mg | ORAL_TABLET | Freq: Four times a day (QID) | ORAL | Status: DC | PRN

## 2014-03-05 NOTE — Progress Notes (Signed)
Chief Complaint  Patient presents with  . Back Pain    on right side only. DId find some Tylenol #3 Wed night, is taking one nightly and helps sleep, somewhat. Would like A1C today in case he has to take steroids.    Patient presents to follow up on right buttock pain, radiating into his right leg.  He has been taking 600-800mg  every 6-8 hours of ibuprofen since Saturday. He is wearing a salicylate patch over the tender area. He has been seeing Best [facility] at [facility]). He presents today for my evaluation/recommendations.  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   He always has some mild back pain.  He drove to the [place] 10/9, lifted some heavy boxes, and had acute onset of pain when he woke up Saturday morning.  The pain was in the right buttock, radiating down the posterior thigh, lateral calf, and to the ankle.  Buttock pain is stabbing.  "bad ache" down the leg.  No numbness or tingling, no weakness or foot drop.  Since taking the ibuprofen he hasn't really noticed much improvement.  He still also needs to take tylenol along with it to help with the discomfort.  The Tylenol #3 helped ease the pain, but caused constipation.  He saw Evan twice in the last week, and reports that he thinks it is more related to the SI joint rather than his lumbar spine.  Up until today he has only been able to do a minimal walk with his dog; today was able to go further.  After working all morning, he had some recurrent pain.  Denies bowel/bladder dysfunction, foot drop, or any weakness.  He had a similar illness after an injury with kettle bells with a trainer in the past, and recalls it took a while to resolve.   He has been watching his diet (although his wife caught him with [employer] in his car!).  He reports that he has lost some inches at his waist.  He is someone who doesn't like to take medications, and is worried if he needs to take steroids. He has  h/o impaired fasting glucose, wanting A1c to be done today, to ensure that it is okay, in case steroids are needed.  PMH, PSH, SH reviewed.  Outpatient Encounter Prescriptions as of 03/05/2014  Medication Sig  . amLODipine (NORVASC) 5 MG tablet TAKE 1 TABLET DAILY  . Ascorbic Acid (VITAMIN C) 1000 MG tablet Take 1,000 mg by mouth daily.    Marland Kitchen aspirin 81 MG tablet Take 81 mg by mouth daily.    Marland Kitchen atorvastatin (LIPITOR) 40 MG tablet TAKE 1 TABLET BY MOUTH EVERY DAY  . b complex vitamins tablet Take 1 tablet by mouth daily.    . Coenzyme Q10 300 MG CAPS Take 1 capsule by mouth daily.    . Fiber TABS Take 1 tablet by mouth daily.    Marland Kitchen lisinopril-hydrochlorothiazide (PRINZIDE,ZESTORETIC) 20-12.5 MG per tablet Take 0.5 tablets by mouth daily.  . Magnesium 500 MG CAPS Take 1 capsule by mouth daily.  . niacin (NIASPAN) 500 MG CR tablet Take 1 tablet (500 mg total) by mouth at bedtime.  . OMEGA 3 1200 MG CAPS Take 1 capsule by mouth daily.   . pindolol (VISKEN) 5 MG tablet TAKE ONE-HALF (1/2) TABLET (2.5 MG) DAILY  . Pomegranate, Punica granatum, (POMEGRANATE EXTRACT PO) Take 2 oz by mouth daily.  . sildenafil (VIAGRA) 100 MG tablet  TAKE 1/2 TO 1 TABLET BY MOUTH EVERY DAY AS NEEDED FOR ED  . vitamin E 400 UNIT capsule Take 400 Units by mouth daily.    Marland Kitchen ZETIA 10 MG tablet Take 1 tablet by mouth  every day  . traMADol (ULTRAM) 50 MG tablet Take 1-2 tablets (50-100 mg total) by mouth every 6 (six) hours as needed for moderate pain or severe pain.  . [DISCONTINUED] methylPREDNIsolone (MEDROL DOSPACK) 4 MG tablet follow package directions   No Known Allergies  ROS:  No fevers, chills, headaches, dizziness, chest pain, URI symptoms, shortness of breath. No GI complaints, bleeding or bruising.  No numbness, tingling, weakness, rashes or other concerns.  PHYSICAL EXAM: BP 130/84  Pulse 56  Ht 5\' 11"  (1.803 m)  Wt 215 lb (97.523 kg)  BMI 30.00 kg/m2 Well developed, pleasant male in no distress Back:  Spine is nontender Mild tenderness at R SI joint Tender deep into right pyriformis muscle and near sciatic notch. Pain with pyriformis stretch. Negative straight leg raise.   Neuro: Normal strength, sensation DTR's symmetric  Lab Results  Component Value Date   HGBA1C 5.4 03/05/2014    ASSESSMENT/PLAN:  Pyriformis syndrome, right - with sciatica.  Some slow improvement with NSAIDs, and chiropractic tx.  Continue. tramadol prn pain; consider muscle relaxant, steroids if worse. - Plan: traMADol (ULTRAM) 50 MG tablet  Impaired fasting glucose - Plan: HgB A1c  Impaired fasting glucose - A1c is normal--reassured (needed reassurance in case course of steroids are recommended later) - Plan: HgB A1c  Essential hypertension, benign - well controlled   Continue ibuprofen 800mg  three times daily with food (or 600mg  every 6 hours). Use tylenol as needed for pain. Use the Ultram as needed for more severe pain. Continue to follow-up with [facility] for chiropractic treatments and [employer] Consider use of a muscle relaxant at bedtime (Skelaxin or Robaxin are the least sedating).  If pain isn't continuing to gradually improve, then we can try a Medrol Dosepak (steroid course).  Your A1c was entirely normal.

## 2014-03-05 NOTE — Patient Instructions (Addendum)
  Continue ibuprofen 800mg  three times daily with food (or 600mg  every 6 hours). Use tylenol as needed for pain. Use the Ultram as needed for more severe pain. Continue to follow-up with [facility] for chiropractic treatments and Active Release [employer] Consider use of a muscle relaxant at bedtime (Skelaxin or Robaxin are the least sedating).  If pain isn't continuing to gradually improve, then we can try a Medrol Dosepak (steroid course).  Your A1c was entirely normal.   Sacroiliac Joint Dysfunction The sacroiliac joint connects the lower part of the spine (the sacrum) with the bones of the pelvis. CAUSES  Sometimes, there is no obvious reason for sacroiliac joint dysfunction. Other times, it may occur   During pregnancy.  After injury, such as:  Car accidents.  Sport-related injuries.  Work-related injuries.  Due to one leg being shorter than the other.  Due to other conditions that affect the joints, such as:  Rheumatoid arthritis.  Gout.  Psoriasis.  Joint infection (septic arthritis). SYMPTOMS  Symptoms may include:  Pain in the:  Lower back.  Buttocks.  Groin.  Thighs and legs.  Difficult sitting, standing, walking, lying, bending or lifting. DIAGNOSIS  A number of tests may be used to help diagnose the cause of sacroiliac joint dysfunction, including:  Imaging tests to look for other causes of pain, including:  MRI.  CT scan.  Bone scan.  Diagnostic injection: During a special x-ray (called fluoroscopy), a needle is put into the sacroiliac joint. A numbing medicine is injected into the joint. If the pain is improved or stopped, the diagnosis of sacroiliac joint dysfunction is more likely. TREATMENT  There are a number of types of treatment used for sacroiliac joint dysfunction, including:  Only take over-the-counter or prescription medicines for pain, discomfort, or fever as directed by your caregiver.  Medications to relax muscles.  Rest.  Decreasing activity can help cut down on painful muscle spasms and allow the back to heal.  Application of heat or ice to the lower back may improve muscle spasms and soothe pain.  Brace. A special back brace, called a sacroiliac belt, can help support the joint while your back is healing.  Physical therapy can help teach comfortable positions and exercises to strengthen muscles that support the sacroiliac joint.  Cortisone injections. Injections of steroid medicine into the joint can help decrease swelling and improve pain.  Hyaluronic acid injections. This chemical improves lubrication within the sacroiliac joint, thereby decreasing pain.  Radiofrequency ablation. A special needle is placed into the joint, where it burns away nerves that are carrying pain messages from the joint.  Surgery. Because pain occurs during movement of the joint, screws and plates may be installed in order to limit or prevent joint motion. HOME CARE INSTRUCTIONS   Take all medications exactly as directed.  Follow instructions regarding both rest and physical activity, to avoid worsening the pain.  Do physical therapy exercises exactly as prescribed. SEEK IMMEDIATE MEDICAL CARE IF:  You experience increasingly severe pain.  You develop new symptoms, such as numbness or tingling in your legs or feet.  You lose bladder or bowel control. Document Released: 08/03/2008 Document Revised: 07/30/2011 Document Reviewed: 08/03/2008 [facility] Patient Information 2015 [place], [place]. This information is not intended to replace advice given to you by your health care provider. Make sure you discuss any questions you have with your health care provider.

## 2014-04-05 ENCOUNTER — Other Ambulatory Visit: Payer: Self-pay | Admitting: Family Medicine

## 2014-04-05 ENCOUNTER — Other Ambulatory Visit: Payer: Self-pay | Admitting: Medical

## 2014-04-29 ENCOUNTER — Telehealth: Payer: Self-pay | Admitting: *Deleted

## 2014-04-29 DIAGNOSIS — R5383 Other fatigue: Secondary | ICD-10-CM

## 2014-04-29 DIAGNOSIS — R7301 Impaired fasting glucose: Secondary | ICD-10-CM

## 2014-04-29 DIAGNOSIS — Z125 Encounter for screening for malignant neoplasm of prostate: Secondary | ICD-10-CM

## 2014-04-29 DIAGNOSIS — I10 Essential (primary) hypertension: Secondary | ICD-10-CM

## 2014-04-29 DIAGNOSIS — E7800 Pure hypercholesterolemia, unspecified: Secondary | ICD-10-CM

## 2014-04-29 DIAGNOSIS — Z5181 Encounter for therapeutic drug level monitoring: Secondary | ICD-10-CM

## 2014-04-29 NOTE — Telephone Encounter (Signed)
Scheduled patient for his IPPE 07/07/14-he would like to come in 06/30/14 for labs. Need to know what to order please.

## 2014-04-29 NOTE — Telephone Encounter (Signed)
Orders entered

## 2014-06-10 ENCOUNTER — Other Ambulatory Visit: Payer: Self-pay | Admitting: Family Medicine

## 2014-06-21 ENCOUNTER — Other Ambulatory Visit: Payer: Self-pay | Admitting: Family Medicine

## 2014-06-30 ENCOUNTER — Other Ambulatory Visit: Payer: Medicare Other

## 2014-06-30 DIAGNOSIS — I10 Essential (primary) hypertension: Secondary | ICD-10-CM | POA: Diagnosis not present

## 2014-06-30 DIAGNOSIS — R7301 Impaired fasting glucose: Secondary | ICD-10-CM

## 2014-06-30 DIAGNOSIS — R5383 Other fatigue: Secondary | ICD-10-CM | POA: Diagnosis not present

## 2014-06-30 DIAGNOSIS — Z125 Encounter for screening for malignant neoplasm of prostate: Secondary | ICD-10-CM

## 2014-06-30 DIAGNOSIS — Z5181 Encounter for therapeutic drug level monitoring: Secondary | ICD-10-CM

## 2014-06-30 DIAGNOSIS — E7800 Pure hypercholesterolemia, unspecified: Secondary | ICD-10-CM

## 2014-06-30 LAB — CBC WITH DIFFERENTIAL/PLATELET
BASOS ABS: 0 10*3/uL (ref 0.0–0.1)
BASOS PCT: 0 % (ref 0–1)
EOS ABS: 0.3 10*3/uL (ref 0.0–0.7)
EOS PCT: 6 % — AB (ref 0–5)
HCT: 41.1 % (ref 39.0–52.0)
Hemoglobin: 13.7 g/dL (ref 13.0–17.0)
Lymphocytes Relative: 21 % (ref 12–46)
Lymphs Abs: 1.2 10*3/uL (ref 0.7–4.0)
MCH: 30.7 pg (ref 26.0–34.0)
MCHC: 33.3 g/dL (ref 30.0–36.0)
MCV: 92.2 fL (ref 78.0–100.0)
MPV: 9.6 fL (ref 8.6–12.4)
Monocytes Absolute: 0.4 10*3/uL (ref 0.1–1.0)
Monocytes Relative: 8 % (ref 3–12)
Neutro Abs: 3.6 10*3/uL (ref 1.7–7.7)
Neutrophils Relative %: 65 % (ref 43–77)
PLATELETS: 189 10*3/uL (ref 150–400)
RBC: 4.46 MIL/uL (ref 4.22–5.81)
RDW: 13.3 % (ref 11.5–15.5)
WBC: 5.5 10*3/uL (ref 4.0–10.5)

## 2014-07-01 LAB — COMPREHENSIVE METABOLIC PANEL
ALK PHOS: 65 U/L (ref 39–117)
ALT: 41 U/L (ref 0–53)
AST: 31 U/L (ref 0–37)
Albumin: 4.5 g/dL (ref 3.5–5.2)
BILIRUBIN TOTAL: 1 mg/dL (ref 0.2–1.2)
BUN: 18 mg/dL (ref 6–23)
CO2: 31 mEq/L (ref 19–32)
CREATININE: 0.87 mg/dL (ref 0.50–1.35)
Calcium: 8.8 mg/dL (ref 8.4–10.5)
Chloride: 105 mEq/L (ref 96–112)
Glucose, Bld: 113 mg/dL — ABNORMAL HIGH (ref 70–99)
Potassium: 4.1 mEq/L (ref 3.5–5.3)
Sodium: 142 mEq/L (ref 135–145)
Total Protein: 5.7 g/dL — ABNORMAL LOW (ref 6.0–8.3)

## 2014-07-01 LAB — LIPID PANEL
Cholesterol: 121 mg/dL (ref 0–200)
HDL: 44 mg/dL (ref >39)
LDL CALC: 61 mg/dL (ref 0–99)
Total CHOL/HDL Ratio: 2.8 Ratio
Triglycerides: 78 mg/dL (ref <150)
VLDL: 16 mg/dL (ref 0–40)

## 2014-07-01 LAB — HEMOGLOBIN A1C
Hgb A1c MFr Bld: 5.5 % (ref <5.7)
Mean Plasma Glucose: 111 mg/dL (ref <117)

## 2014-07-01 LAB — TSH: TSH: 3.249 u[IU]/mL (ref 0.350–4.500)

## 2014-07-01 LAB — PSA, MEDICARE: PSA: 2.08 ng/mL (ref <=4.00)

## 2014-07-07 ENCOUNTER — Ambulatory Visit (INDEPENDENT_AMBULATORY_CARE_PROVIDER_SITE_OTHER): Payer: Medicare Other | Admitting: Family Medicine

## 2014-07-07 ENCOUNTER — Encounter: Payer: Self-pay | Admitting: Family Medicine

## 2014-07-07 VITALS — BP 130/80 | HR 48 | Ht 71.0 in | Wt 217.0 lb

## 2014-07-07 DIAGNOSIS — Z0000 Encounter for general adult medical examination without abnormal findings: Secondary | ICD-10-CM

## 2014-07-07 DIAGNOSIS — E7800 Pure hypercholesterolemia, unspecified: Secondary | ICD-10-CM

## 2014-07-07 DIAGNOSIS — I2510 Atherosclerotic heart disease of native coronary artery without angina pectoris: Secondary | ICD-10-CM | POA: Diagnosis not present

## 2014-07-07 DIAGNOSIS — Z23 Encounter for immunization: Secondary | ICD-10-CM

## 2014-07-07 DIAGNOSIS — R7301 Impaired fasting glucose: Secondary | ICD-10-CM | POA: Diagnosis not present

## 2014-07-07 DIAGNOSIS — I10 Essential (primary) hypertension: Secondary | ICD-10-CM

## 2014-07-07 DIAGNOSIS — Z7189 Other specified counseling: Secondary | ICD-10-CM

## 2014-07-07 LAB — POCT URINALYSIS DIPSTICK
Bilirubin, UA: NEGATIVE
GLUCOSE UA: NEGATIVE
Ketones, UA: NEGATIVE
Leukocytes, UA: NEGATIVE
Nitrite, UA: NEGATIVE
PH UA: 6
Protein, UA: NEGATIVE
RBC UA: NEGATIVE
Spec Grav, UA: >=1.03
UROBILINOGEN UA: NEGATIVE

## 2014-07-07 MED ORDER — ATORVASTATIN CALCIUM 40 MG PO TABS
ORAL_TABLET | ORAL | Status: DC
Start: 2014-07-07 — End: 2015-01-09

## 2014-07-07 MED ORDER — AMLODIPINE BESYLATE 5 MG PO TABS: ORAL_TABLET | ORAL | Status: DC

## 2014-07-07 MED ORDER — PINDOLOL 5 MG PO TABS: ORAL_TABLET | ORAL | Status: DC
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
## 2014-07-07 NOTE — Patient Instructions (Signed)

## 2014-09-06 ENCOUNTER — Other Ambulatory Visit: Payer: Self-pay | Admitting: Family Medicine

## 2014-10-05 ENCOUNTER — Telehealth (HOSPITAL_COMMUNITY): Payer: Self-pay | Admitting: *Deleted

## 2014-10-05 NOTE — Telephone Encounter (Signed)
Pt wants to schedule a stress test. There isnt an order in epic and there isnt a note stating that he needs one.  Please Advise

## 2014-10-06 ENCOUNTER — Other Ambulatory Visit: Payer: Self-pay | Admitting: *Deleted

## 2014-10-06 DIAGNOSIS — I25810 Atherosclerosis of coronary artery bypass graft(s) without angina pectoris: Secondary | ICD-10-CM

## 2014-10-06 DIAGNOSIS — I10 Essential (primary) hypertension: Secondary | ICD-10-CM

## 2014-10-06 NOTE — Telephone Encounter (Signed)
Per Dr. Evette Georges last office dictation dated 2/15 patient needs a stress test. The patient was to check with his insurance as to what they would pay for. He was going to call us back to advise. Patient called in to scheduling requesting for a stress test to be ordered. He did not specify what type. Nuclear versus treadmill. I will call the patient to see if he can tell me which study his insurance will pay for. A order will be placed per Dr. Evette Georges recommendations at his last office visit.

## 2014-10-06 NOTE — Telephone Encounter (Signed)
Patient 's assistant Mateo Flow ( name not Romie Minus as stated in previous note) called back to inform that he requests a nuclear stress test. This will be ordered.

## 2014-10-06 NOTE — Telephone Encounter (Signed)
Called patient's office and spoke with his assistant, Romie Minus. She is the one who actually called to set up the stress test. She will check with the patient to see which kind his insurance will cover and call me back.

## 2014-10-11 ENCOUNTER — Telehealth (HOSPITAL_COMMUNITY): Payer: Self-pay | Admitting: *Deleted

## 2014-10-22 ENCOUNTER — Telehealth (HOSPITAL_COMMUNITY): Payer: Self-pay

## 2014-10-22 NOTE — Telephone Encounter (Signed)
Encounter complete. 

## 2014-10-25 ENCOUNTER — Telehealth (HOSPITAL_COMMUNITY): Payer: Self-pay | Admitting: *Deleted

## 2014-10-26 ENCOUNTER — Telehealth (HOSPITAL_COMMUNITY): Payer: Self-pay

## 2014-10-26 NOTE — Telephone Encounter (Signed)
Encounter complete. 

## 2014-10-27 ENCOUNTER — Ambulatory Visit (HOSPITAL_COMMUNITY)
Admission: RE | Admit: 2014-10-27 | Discharge: 2014-10-27 | Disposition: A | Discharge status: Home / Self Care | Payer: Medicare Other | Source: Ambulatory Visit | Attending: Cardiovascular Disease | Admitting: Cardiovascular Disease

## 2014-10-27 ENCOUNTER — Telehealth (HOSPITAL_COMMUNITY): Payer: Self-pay

## 2014-10-27 DIAGNOSIS — I10 Essential (primary) hypertension: Secondary | ICD-10-CM | POA: Diagnosis not present

## 2014-10-27 DIAGNOSIS — I25810 Atherosclerosis of coronary artery bypass graft(s) without angina pectoris: Secondary | ICD-10-CM | POA: Insufficient documentation

## 2014-10-27 LAB — MYOCARDIAL PERFUSION IMAGING
CHL CUP MPHR: 155 {beats}/min
CHL CUP NUCLEAR SSS: 4
CSEPHR: 103 %
CSEPPHR: 160 {beats}/min
Estimated workload: 12.9 METS
Exercise duration (min): 10 min
Exercise duration (sec): 46 s
LV dias vol: 104 mL
LV sys vol: 42 mL
NUC STRESS TID: 1.1
Nuc Stress EF: 59 %
RPE: 32480
Rest HR: 61 {beats}/min
SDS: 0
SRS: 4

## 2014-10-27 MED ORDER — TECHNETIUM TC 99M SESTAMIBI GENERIC - CARDIOLITE
10.9000 | Freq: Once | INTRAVENOUS | Status: AC | PRN
  Administered 2014-10-27: 10.9 via INTRAVENOUS

## 2014-10-27 MED ORDER — TECHNETIUM TC 99M SESTAMIBI GENERIC - CARDIOLITE
30.9000 | Freq: Once | INTRAVENOUS | Status: AC | PRN
  Administered 2014-10-27: 30.9 via INTRAVENOUS

## 2014-10-27 NOTE — Telephone Encounter (Signed)
Encounter complete. 

## 2014-10-28 ENCOUNTER — Encounter: Payer: Self-pay | Admitting: Family Medicine

## 2014-11-16 ENCOUNTER — Other Ambulatory Visit: Payer: Self-pay | Admitting: Family Medicine

## 2014-11-16 NOTE — Telephone Encounter (Signed)
Is this okay to refill? 

## 2014-11-16 NOTE — Telephone Encounter (Signed)
Sent to [facility]

## 2014-11-21 ENCOUNTER — Telehealth: Payer: Self-pay | Admitting: Family Medicine

## 2014-11-21 NOTE — Telephone Encounter (Signed)
Initiated P.A. VIAGRA

## 2014-11-24 MED ORDER — SILDENAFIL CITRATE 20 MG PO TABS: ORAL_TABLET | ORAL | Status: DC

## 2014-11-24 NOTE — Telephone Encounter (Signed)
Sent in med to pharmacy 

## 2014-11-24 NOTE — Telephone Encounter (Signed)
Ok to rx sildenafil as requested. It is 20mg  dose, with instructions to take 2-4 tablets once daily as needed for ED

## 2014-11-24 NOTE — Telephone Encounter (Signed)
P.A. Viagra denied, Medicare excludes all ED meds. Viagra cost $900.   Called pt & he wants to switch to Revatio/Sildenafil at [facility] [phone] for #30 20mg  to pay out of pocket (cost $45)

## 2014-12-02 ENCOUNTER — Other Ambulatory Visit: Payer: Self-pay | Admitting: Family Medicine

## 2014-12-30 ENCOUNTER — Other Ambulatory Visit: Payer: Medicare Other

## 2014-12-30 DIAGNOSIS — E7800 Pure hypercholesterolemia, unspecified: Secondary | ICD-10-CM

## 2014-12-30 DIAGNOSIS — R7301 Impaired fasting glucose: Secondary | ICD-10-CM

## 2014-12-30 LAB — HEPATIC FUNCTION PANEL
ALT: 34 U/L (ref 9–46)
AST: 28 U/L (ref 10–35)
Albumin: 4.3 g/dL (ref 3.6–5.1)
Alkaline Phosphatase: 60 U/L (ref 40–115)
BILIRUBIN DIRECT: 0.3 mg/dL — AB (ref <=0.2)
BILIRUBIN INDIRECT: 0.7 mg/dL (ref 0.2–1.2)
Total Bilirubin: 1 mg/dL (ref 0.2–1.2)
Total Protein: 6.1 g/dL (ref 6.1–8.1)

## 2014-12-30 LAB — HEMOGLOBIN A1C
HEMOGLOBIN A1C: 5.9 % — AB (ref <5.7)
MEAN PLASMA GLUCOSE: 123 mg/dL — AB (ref <117)

## 2014-12-30 LAB — LIPID PANEL
CHOLESTEROL: 117 mg/dL — AB (ref 125–200)
HDL: 55 mg/dL (ref >=40)
LDL Cholesterol: 49 mg/dL (ref <130)
TRIGLYCERIDES: 67 mg/dL (ref <150)
Total CHOL/HDL Ratio: 2.1 Ratio (ref <=5.0)
VLDL: 13 mg/dL (ref <30)

## 2014-12-30 LAB — GLUCOSE, RANDOM: GLUCOSE: 107 mg/dL — AB (ref 65–99)

## 2015-01-04 ENCOUNTER — Telehealth: Payer: Self-pay

## 2015-01-04 NOTE — Telephone Encounter (Signed)
Patient was put through on my Voice Mail wanted to get his lab results

## 2015-01-09 ENCOUNTER — Other Ambulatory Visit: Payer: Self-pay | Admitting: Family Medicine

## 2015-01-28 ENCOUNTER — Encounter: Payer: Self-pay | Admitting: Family Medicine

## 2015-04-06 DIAGNOSIS — F4321 Adjustment disorder with depressed mood: Secondary | ICD-10-CM | POA: Diagnosis not present

## 2015-04-18 ENCOUNTER — Other Ambulatory Visit: Payer: Self-pay | Admitting: Family Medicine

## 2015-05-17 ENCOUNTER — Telehealth: Payer: Self-pay | Admitting: Family Medicine

## 2015-05-17 DIAGNOSIS — M5430 Sciatica, unspecified side: Secondary | ICD-10-CM

## 2015-05-17 NOTE — Telephone Encounter (Signed)
Scheduled pt tomorrow evening with [facility] @ 7:30pm but patient needs to be there at 7:00pm for check in. I have faxed over order to get MRI- L spine done to [phone]. Patient is aware. Pt has straight medicare primary so prior auth does not need to be done.

## 2015-05-17 NOTE — Telephone Encounter (Signed)
Crippling sciatic nerve pain. This episode happened after a 4 hour surgery     Needs to go for MRI Prefers to go to [facility] because they do night hours and he is very busy with a lot of surgeries scheduled.  He is free to go tomorrow ( Wednesday) night or Thursday night   I did inform him that you were out of the office but hopefully would be checking messages

## 2015-05-17 NOTE — Telephone Encounter (Signed)
Spoke to him again and relayed the information you gave  No weakness, just "crazy" pain. He states just like before but on other side Does not have time to come in, crazy surgery season (end of year) He is seeing  Jessy Oto (?spelling) chiro. On Thursday that you told him about

## 2015-05-17 NOTE — Telephone Encounter (Signed)
Ok to try and get MRI of L-S spine at [facility] during his preferred times.  Person scheduling may need to get more info from Dr. Sabra Heck if needed for auth.   (the OV would be for pain meds and/or steroids; if he doesn't feel he needs this, then we can just start with the MRI)

## 2015-05-17 NOTE — Telephone Encounter (Signed)
An MRI doesn't fix anything, just diagnostic, as I'm sure he knows.  The question is should someone in the office see him today (to evaluate him, evaluate for any weakness, some information that we might need to get prior authorization, as it is likely needed in order to get MRI), and also offer treatment (shot of steroids, vs other pain meds).  I would recommend eval for pain meds, possibly steroids, and exam to eval for any weakness.  Okay to try and order/schedule MRI

## 2015-05-18 DIAGNOSIS — M419 Scoliosis, unspecified: Secondary | ICD-10-CM | POA: Diagnosis not present

## 2015-05-18 DIAGNOSIS — M4727 Other spondylosis with radiculopathy, lumbosacral region: Secondary | ICD-10-CM | POA: Diagnosis not present

## 2015-05-18 DIAGNOSIS — M4726 Other spondylosis with radiculopathy, lumbar region: Secondary | ICD-10-CM | POA: Diagnosis not present

## 2015-05-18 DIAGNOSIS — M5116 Intervertebral disc disorders with radiculopathy, lumbar region: Secondary | ICD-10-CM | POA: Diagnosis not present

## 2015-05-19 ENCOUNTER — Telehealth: Payer: Self-pay | Admitting: Family Medicine

## 2015-05-19 NOTE — Telephone Encounter (Signed)
I called patient with his MRI results (no acute abnormality, multilevel disc degenerative changes and facet arthrosis. Mild foraminal stenosis, scattered, greatest at R L3-4 and L L5-S1).  He just left [facility] after seeing Dr. 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  Pain is tolerable during the day, sometimes needs to take 1 hydrocodone at bedtime if pain is severe.  Has skelaxin at home that he hasn't tried yet.  Advised pt that we will fax a copy of the results to [facility] for their records.

## 2015-05-29 ENCOUNTER — Other Ambulatory Visit: Payer: Self-pay | Admitting: Family Medicine

## 2015-06-10 ENCOUNTER — Telehealth: Payer: Self-pay | Admitting: Family Medicine

## 2015-06-10 DIAGNOSIS — I10 Essential (primary) hypertension: Secondary | ICD-10-CM

## 2015-06-10 DIAGNOSIS — Z5181 Encounter for therapeutic drug level monitoring: Secondary | ICD-10-CM

## 2015-06-10 DIAGNOSIS — E7800 Pure hypercholesterolemia, unspecified: Secondary | ICD-10-CM

## 2015-06-10 DIAGNOSIS — Z125 Encounter for screening for malignant neoplasm of prostate: Secondary | ICD-10-CM

## 2015-06-10 DIAGNOSIS — R7301 Impaired fasting glucose: Secondary | ICD-10-CM

## 2015-06-10 NOTE — Telephone Encounter (Signed)
Has appt 07/13/15 for med check Plus/AWV, can he come in earlier for labs?

## 2015-06-10 NOTE — Telephone Encounter (Signed)
Future orders were entered. 

## 2015-06-14 ENCOUNTER — Encounter: Payer: Self-pay | Admitting: Family Medicine

## 2015-06-14 NOTE — Telephone Encounter (Signed)
Pt informed, he will stop be one morning before his 2/22 appt for fasting labs

## 2015-06-20 ENCOUNTER — Other Ambulatory Visit: Payer: Self-pay | Admitting: Family Medicine

## 2015-06-21 ENCOUNTER — Other Ambulatory Visit: Payer: Self-pay | Admitting: *Deleted

## 2015-06-21 MED ORDER — PINDOLOL 5 MG PO TABS: ORAL_TABLET | ORAL | Status: DC

## 2015-06-21 MED ORDER — AMLODIPINE BESYLATE 5 MG PO TABS: ORAL_TABLET | ORAL | Status: DC

## 2015-07-07 ENCOUNTER — Other Ambulatory Visit: Payer: Medicare Other

## 2015-07-07 ENCOUNTER — Other Ambulatory Visit: Payer: Self-pay

## 2015-07-07 DIAGNOSIS — Z5181 Encounter for therapeutic drug level monitoring: Secondary | ICD-10-CM | POA: Diagnosis not present

## 2015-07-07 DIAGNOSIS — R7301 Impaired fasting glucose: Secondary | ICD-10-CM | POA: Diagnosis not present

## 2015-07-07 DIAGNOSIS — I10 Essential (primary) hypertension: Secondary | ICD-10-CM

## 2015-07-07 DIAGNOSIS — E7800 Pure hypercholesterolemia, unspecified: Secondary | ICD-10-CM | POA: Diagnosis not present

## 2015-07-07 DIAGNOSIS — Z125 Encounter for screening for malignant neoplasm of prostate: Secondary | ICD-10-CM | POA: Diagnosis not present

## 2015-07-07 LAB — LIPID PANEL
CHOL/HDL RATIO: 2.5 ratio (ref <=5.0)
Cholesterol: 122 mg/dL — ABNORMAL LOW (ref 125–200)
HDL: 49 mg/dL (ref >=40)
LDL CALC: 60 mg/dL (ref <130)
TRIGLYCERIDES: 67 mg/dL (ref <150)
VLDL: 13 mg/dL (ref <30)

## 2015-07-07 LAB — COMPREHENSIVE METABOLIC PANEL
ALK PHOS: 61 U/L (ref 40–115)
ALT: 46 U/L (ref 9–46)
AST: 35 U/L (ref 10–35)
Albumin: 4.4 g/dL (ref 3.6–5.1)
BUN: 18 mg/dL (ref 7–25)
CALCIUM: 9 mg/dL (ref 8.6–10.3)
CHLORIDE: 103 mmol/L (ref 98–110)
CO2: 30 mmol/L (ref 20–31)
Creat: 0.82 mg/dL (ref 0.70–1.25)
GLUCOSE: 106 mg/dL — AB (ref 65–99)
POTASSIUM: 4.1 mmol/L (ref 3.5–5.3)
Sodium: 140 mmol/L (ref 135–146)
Total Bilirubin: 0.9 mg/dL (ref 0.2–1.2)
Total Protein: 5.9 g/dL — ABNORMAL LOW (ref 6.1–8.1)

## 2015-07-07 LAB — CBC WITH DIFFERENTIAL/PLATELET
BASOS ABS: 0 10*3/uL (ref 0.0–0.1)
Basophils Relative: 0 % (ref 0–1)
EOS ABS: 0.2 10*3/uL (ref 0.0–0.7)
EOS PCT: 4 % (ref 0–5)
HEMATOCRIT: 42 % (ref 39.0–52.0)
Hemoglobin: 13.9 g/dL (ref 13.0–17.0)
LYMPHS ABS: 0.8 10*3/uL (ref 0.7–4.0)
Lymphocytes Relative: 14 % (ref 12–46)
MCH: 30.6 pg (ref 26.0–34.0)
MCHC: 33.1 g/dL (ref 30.0–36.0)
MCV: 92.5 fL (ref 78.0–100.0)
MPV: 9.5 fL (ref 8.6–12.4)
Monocytes Absolute: 0.6 10*3/uL (ref 0.1–1.0)
Monocytes Relative: 11 % (ref 3–12)
Neutro Abs: 4.1 10*3/uL (ref 1.7–7.7)
Neutrophils Relative %: 71 % (ref 43–77)
Platelets: 185 10*3/uL (ref 150–400)
RBC: 4.54 MIL/uL (ref 4.22–5.81)
RDW: 13.9 % (ref 11.5–15.5)
WBC: 5.8 10*3/uL (ref 4.0–10.5)

## 2015-07-07 LAB — HEMOGLOBIN A1C
HEMOGLOBIN A1C: 5.6 % (ref <5.7)
Mean Plasma Glucose: 114 mg/dL (ref <117)

## 2015-07-08 LAB — PSA, MEDICARE: PSA: 1.7 ng/mL (ref <=4.00)

## 2015-07-11 ENCOUNTER — Other Ambulatory Visit: Payer: Self-pay | Admitting: Family Medicine

## 2015-07-11 ENCOUNTER — Other Ambulatory Visit: Payer: Medicare Other

## 2015-07-12 NOTE — Telephone Encounter (Signed)
Pt has an appt on 2/22. Will deny as he should run out 2/22 according to refill

## 2015-07-13 ENCOUNTER — Encounter: Payer: Self-pay | Admitting: Family Medicine

## 2015-07-13 ENCOUNTER — Ambulatory Visit (INDEPENDENT_AMBULATORY_CARE_PROVIDER_SITE_OTHER): Payer: Medicare Other | Admitting: Family Medicine

## 2015-07-13 VITALS — BP 122/72 | HR 56 | Ht 71.0 in | Wt 216.4 lb

## 2015-07-13 DIAGNOSIS — Z5181 Encounter for therapeutic drug level monitoring: Secondary | ICD-10-CM | POA: Diagnosis not present

## 2015-07-13 DIAGNOSIS — I2510 Atherosclerotic heart disease of native coronary artery without angina pectoris: Secondary | ICD-10-CM | POA: Diagnosis not present

## 2015-07-13 DIAGNOSIS — R7301 Impaired fasting glucose: Secondary | ICD-10-CM | POA: Diagnosis not present

## 2015-07-13 DIAGNOSIS — Z0000 Encounter for general adult medical examination without abnormal findings: Secondary | ICD-10-CM | POA: Diagnosis not present

## 2015-07-13 DIAGNOSIS — Z23 Encounter for immunization: Secondary | ICD-10-CM | POA: Diagnosis not present

## 2015-07-13 DIAGNOSIS — I10 Essential (primary) hypertension: Secondary | ICD-10-CM

## 2015-07-13 DIAGNOSIS — E7800 Pure hypercholesterolemia, unspecified: Secondary | ICD-10-CM

## 2015-07-13 MED ORDER — ATORVASTATIN CALCIUM 40 MG PO TABS: ORAL_TABLET | ORAL | Status: DC

## 2015-07-13 NOTE — Progress Notes (Signed)
Error

## 2015-07-13 NOTE — Patient Instructions (Signed)
  HEALTH MAINTENANCE RECOMMENDATIONS:  It is recommended that you get at least 30 minutes of aerobic exercise at least 5 days/week (for weight loss, you may need as much as 60-90 minutes). This can be any activity that gets your heart rate up. This can be divided in 10-15 minute intervals if needed, but try and build up your endurance at least once a week.  Weight bearing exercise is also recommended twice weekly.  Eat a healthy diet with lots of vegetables, fruits and fiber.  "Colorful" foods have a lot of vitamins (ie green vegetables, tomatoes, red peppers, etc).  Limit sweet tea, regular sodas and alcoholic beverages, all of which has a lot of calories and sugar.  Up to 2 alcoholic drinks daily may be beneficial for men (unless trying to lose weight, watch sugars).  Drink a lot of water.  Sunscreen of at least SPF 30 should be used on all sun-exposed parts of the skin when outside between the hours of 10 am and 4 pm (not just when at beach or pool, but even with exercise, golf, tennis, and yard work!)  Use a sunscreen that says "broad spectrum" so it covers both UVA and UVB rays, and make sure to reapply every 1-2 hours.  Remember to change the batteries in your smoke detectors when changing your clock times in the spring and fall.  Use your seat belt every time you are in a car, and please drive safely and not be distracted with cell phones and texting while driving.    Jackson Reed , Thank you for taking time to come for your Medicare Wellness Visit. I appreciate your ongoing commitment to your health goals. Please review the following plan we discussed and let me know if I can assist you in the future.   These are the goals we discussed: Goals    None      This is a list of the screening recommended for you and due dates:  Health Maintenance  Topic Date Due  .  Hepatitis C: One time screening is recommended by Center for Disease Control  (CDC) for  adults born from [birth year] through 1967.    Sep 24, 1950  . Complete foot exam   11/02/1958  . Eye exam for diabetics  11/02/1958  . Flu Shot  12/20/2014  . Pneumonia vaccines (2 of 2 - PPSV23) 07/08/2015  . Hemoglobin A1C  01/04/2016  . Colon Cancer Screening  07/20/2019  . Tetanus Vaccine  10/18/2021  . Shingles Vaccine  Completed   Don't worry about Hepatitis C screening--this was done when you donated blood. You don't have diabetes, just pre-diabetes (ignore foot exam and eye exam for diabetics above). You had your flu shot this year; get every Fall. You are getting pneumovax today, to complete your pneumonia vaccine series.
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
## 2015-07-14 ENCOUNTER — Encounter: Payer: Self-pay | Admitting: Family Medicine

## 2015-08-04 ENCOUNTER — Other Ambulatory Visit: Payer: Self-pay | Admitting: Family Medicine

## 2015-08-04 NOTE — Telephone Encounter (Signed)
Is this okay?

## 2015-08-22 ENCOUNTER — Telehealth: Payer: Self-pay | Admitting: Family Medicine

## 2015-08-22 MED ORDER — OSELTAMIVIR PHOSPHATE 75 MG PO CAPS: 75.0000 mg | ORAL_CAPSULE | Freq: Every day | ORAL | Status: DC

## 2015-08-22 NOTE — Telephone Encounter (Signed)
Wife diagnosed today with influenza A. She is asking for tamiflu rx for her husband to take preventatively.

## 2015-08-29 ENCOUNTER — Other Ambulatory Visit: Payer: Self-pay | Admitting: Family Medicine

## 2015-09-05 ENCOUNTER — Other Ambulatory Visit: Payer: Self-pay | Admitting: Family Medicine

## 2015-09-05 NOTE — Telephone Encounter (Signed)
V This is a Dr.Knapp pt

## 2015-12-12 ENCOUNTER — Other Ambulatory Visit: Payer: Self-pay | Admitting: Family Medicine

## 2016-01-12 ENCOUNTER — Other Ambulatory Visit: Payer: Medicare Other

## 2016-01-26 ENCOUNTER — Other Ambulatory Visit: Payer: Medicare Other

## 2016-01-26 DIAGNOSIS — Z5181 Encounter for therapeutic drug level monitoring: Secondary | ICD-10-CM | POA: Diagnosis not present

## 2016-01-26 DIAGNOSIS — R7301 Impaired fasting glucose: Secondary | ICD-10-CM | POA: Diagnosis not present

## 2016-01-26 LAB — HEPATIC FUNCTION PANEL
ALBUMIN: 4.3 g/dL (ref 3.6–5.1)
ALK PHOS: 70 U/L (ref 40–115)
ALT: 39 U/L (ref 9–46)
AST: 32 U/L (ref 10–35)
BILIRUBIN INDIRECT: 0.5 mg/dL (ref 0.2–1.2)
Bilirubin, Direct: 0.2 mg/dL (ref <=0.2)
TOTAL PROTEIN: 5.8 g/dL — AB (ref 6.1–8.1)
Total Bilirubin: 0.7 mg/dL (ref 0.2–1.2)

## 2016-01-26 LAB — GLUCOSE, RANDOM: Glucose, Bld: 118 mg/dL — ABNORMAL HIGH (ref 65–99)

## 2016-01-27 LAB — HEMOGLOBIN A1C
Hgb A1c MFr Bld: 5.5 % (ref <5.7)
Mean Plasma Glucose: 111 mg/dL

## 2016-02-01 DIAGNOSIS — L821 Other seborrheic keratosis: Secondary | ICD-10-CM | POA: Diagnosis not present

## 2016-02-01 DIAGNOSIS — D171 Benign lipomatous neoplasm of skin and subcutaneous tissue of trunk: Secondary | ICD-10-CM | POA: Diagnosis not present

## 2016-02-01 DIAGNOSIS — L720 Epidermal cyst: Secondary | ICD-10-CM | POA: Diagnosis not present

## 2016-02-13 ENCOUNTER — Other Ambulatory Visit: Payer: Self-pay | Admitting: Family Medicine

## 2016-03-04 ENCOUNTER — Other Ambulatory Visit: Payer: Self-pay | Admitting: Family Medicine

## 2016-05-30 ENCOUNTER — Other Ambulatory Visit: Payer: Self-pay | Admitting: Family Medicine

## 2016-05-30 NOTE — Telephone Encounter (Signed)
Is this okay to refill? 

## 2016-06-16 ENCOUNTER — Other Ambulatory Visit: Payer: Self-pay | Admitting: Family Medicine

## 2016-06-21 ENCOUNTER — Telehealth: Payer: Self-pay | Admitting: Family Medicine

## 2016-06-21 DIAGNOSIS — I10 Essential (primary) hypertension: Secondary | ICD-10-CM

## 2016-06-21 DIAGNOSIS — Z5181 Encounter for therapeutic drug level monitoring: Secondary | ICD-10-CM

## 2016-06-21 DIAGNOSIS — Z0000 Encounter for general adult medical examination without abnormal findings: Secondary | ICD-10-CM

## 2016-06-21 DIAGNOSIS — I2510 Atherosclerotic heart disease of native coronary artery without angina pectoris: Secondary | ICD-10-CM

## 2016-06-21 DIAGNOSIS — R5383 Other fatigue: Secondary | ICD-10-CM

## 2016-06-21 DIAGNOSIS — E7800 Pure hypercholesterolemia, unspecified: Secondary | ICD-10-CM

## 2016-06-21 DIAGNOSIS — R7301 Impaired fasting glucose: Secondary | ICD-10-CM

## 2016-06-21 DIAGNOSIS — Z125 Encounter for screening for malignant neoplasm of prostate: Secondary | ICD-10-CM

## 2016-06-21 NOTE — Telephone Encounter (Signed)
Advise pt that orders were entered--okay to schedule lab visit. Also, let him know that for some reason, it was saying the PSA wasn't going to be covered, needs to sign a waiver (ABN).  I've never encountered this before. I believe that we usually monitor PSA's (has always been fine).  Charge may be $47--let him be aware, and he will need to sign ABN.

## 2016-06-21 NOTE — Telephone Encounter (Signed)
Dr Sabra Heck called and wants to know if he can come in on Feb the 20th and have his fasting blood work done before his medicare well visit, on Feb the 28th please advise pt can be reached back at his office [phone]

## 2016-06-22 NOTE — Telephone Encounter (Signed)
Pt made aware of labs- lab scheduled 07/10/2016. He was made aware of PSA and ABN. He said he will pay for it no matter the cost. Thinks PSA was covered yesterday. Jackson Reed

## 2016-06-27 ENCOUNTER — Other Ambulatory Visit: Payer: Self-pay | Admitting: Family Medicine

## 2016-07-10 ENCOUNTER — Other Ambulatory Visit: Payer: Medicare Other

## 2016-07-10 DIAGNOSIS — I10 Essential (primary) hypertension: Secondary | ICD-10-CM

## 2016-07-10 DIAGNOSIS — R7301 Impaired fasting glucose: Secondary | ICD-10-CM | POA: Diagnosis not present

## 2016-07-10 DIAGNOSIS — Z125 Encounter for screening for malignant neoplasm of prostate: Secondary | ICD-10-CM | POA: Diagnosis not present

## 2016-07-10 DIAGNOSIS — Z5181 Encounter for therapeutic drug level monitoring: Secondary | ICD-10-CM | POA: Diagnosis not present

## 2016-07-10 DIAGNOSIS — Z0000 Encounter for general adult medical examination without abnormal findings: Secondary | ICD-10-CM | POA: Diagnosis not present

## 2016-07-10 DIAGNOSIS — E7800 Pure hypercholesterolemia, unspecified: Secondary | ICD-10-CM | POA: Diagnosis not present

## 2016-07-10 DIAGNOSIS — R5383 Other fatigue: Secondary | ICD-10-CM | POA: Diagnosis not present

## 2016-07-10 DIAGNOSIS — I2510 Atherosclerotic heart disease of native coronary artery without angina pectoris: Secondary | ICD-10-CM

## 2016-07-10 LAB — LIPID PANEL
CHOL/HDL RATIO: 2.6 ratio (ref <5.0)
CHOLESTEROL: 105 mg/dL (ref <200)
HDL: 41 mg/dL (ref >40)
LDL CALC: 53 mg/dL (ref <100)
Triglycerides: 56 mg/dL (ref <150)
VLDL: 11 mg/dL (ref <30)

## 2016-07-10 LAB — CBC WITH DIFFERENTIAL/PLATELET
BASOS ABS: 0 {cells}/uL (ref 0–200)
BASOS PCT: 0 %
EOS ABS: 106 {cells}/uL (ref 15–500)
Eosinophils Relative: 2 %
HEMATOCRIT: 41.1 % (ref 38.5–50.0)
Hemoglobin: 13.9 g/dL (ref 13.2–17.1)
LYMPHS PCT: 8 %
Lymphs Abs: 424 cells/uL — ABNORMAL LOW (ref 850–3900)
MCH: 30.9 pg (ref 27.0–33.0)
MCHC: 33.8 g/dL (ref 32.0–36.0)
MCV: 91.3 fL (ref 80.0–100.0)
MONO ABS: 689 {cells}/uL (ref 200–950)
MONOS PCT: 13 %
MPV: 9.5 fL (ref 7.5–12.5)
NEUTROS PCT: 77 %
Neutro Abs: 4081 cells/uL (ref 1500–7800)
PLATELETS: 142 10*3/uL (ref 140–400)
RBC: 4.5 MIL/uL (ref 4.20–5.80)
RDW: 12.7 % (ref 11.0–15.0)
WBC: 5.3 10*3/uL (ref 4.0–10.5)

## 2016-07-10 LAB — COMPREHENSIVE METABOLIC PANEL
ALT: 46 U/L (ref 9–46)
AST: 38 U/L — ABNORMAL HIGH (ref 10–35)
Albumin: 4.4 g/dL (ref 3.6–5.1)
Alkaline Phosphatase: 70 U/L (ref 40–115)
BUN: 15 mg/dL (ref 7–25)
CHLORIDE: 103 mmol/L (ref 98–110)
CO2: 27 mmol/L (ref 20–31)
CREATININE: 0.99 mg/dL (ref 0.70–1.25)
Calcium: 9.1 mg/dL (ref 8.6–10.3)
Glucose, Bld: 115 mg/dL — ABNORMAL HIGH (ref 65–99)
POTASSIUM: 4.3 mmol/L (ref 3.5–5.3)
Sodium: 140 mmol/L (ref 135–146)
TOTAL PROTEIN: 6.1 g/dL (ref 6.1–8.1)
Total Bilirubin: 0.8 mg/dL (ref 0.2–1.2)

## 2016-07-10 LAB — TSH: TSH: 1.18 m[IU]/L (ref 0.40–4.50)

## 2016-07-10 LAB — PSA: PSA: 1 ng/mL (ref <=4.0)

## 2016-07-11 LAB — HEMOGLOBIN A1C
Hgb A1c MFr Bld: 5.5 % (ref <5.7)
Mean Plasma Glucose: 111 mg/dL

## 2016-07-17 DIAGNOSIS — N529 Male erectile dysfunction, unspecified: Secondary | ICD-10-CM | POA: Insufficient documentation
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
## 2016-07-18 ENCOUNTER — Ambulatory Visit (INDEPENDENT_AMBULATORY_CARE_PROVIDER_SITE_OTHER): Payer: Medicare Other | Admitting: Family Medicine

## 2016-07-18 ENCOUNTER — Encounter: Payer: Self-pay | Admitting: Family Medicine

## 2016-07-18 VITALS — BP 114/76 | HR 52 | Temp 97.9°F | Ht 71.0 in | Wt 208.0 lb

## 2016-07-18 DIAGNOSIS — I2510 Atherosclerotic heart disease of native coronary artery without angina pectoris: Secondary | ICD-10-CM

## 2016-07-18 DIAGNOSIS — Z0000 Encounter for general adult medical examination without abnormal findings: Secondary | ICD-10-CM | POA: Diagnosis not present

## 2016-07-18 DIAGNOSIS — Z5181 Encounter for therapeutic drug level monitoring: Secondary | ICD-10-CM | POA: Diagnosis not present

## 2016-07-18 DIAGNOSIS — N529 Male erectile dysfunction, unspecified: Secondary | ICD-10-CM | POA: Diagnosis not present

## 2016-07-18 DIAGNOSIS — E7800 Pure hypercholesterolemia, unspecified: Secondary | ICD-10-CM | POA: Diagnosis not present

## 2016-07-18 DIAGNOSIS — R7301 Impaired fasting glucose: Secondary | ICD-10-CM

## 2016-07-18 DIAGNOSIS — I10 Essential (primary) hypertension: Secondary | ICD-10-CM

## 2016-07-18 MED ORDER — ATORVASTATIN CALCIUM 40 MG PO TABS: ORAL_TABLET | ORAL | 3 refills | Status: DC

## 2016-07-18 NOTE — Patient Instructions (Signed)
  HEALTH MAINTENANCE RECOMMENDATIONS:  It is recommended that you get at least 30 minutes of aerobic exercise at least 5 days/week (for weight loss, you may need as much as 60-90 minutes). This can be any activity that gets your heart rate up. This can be divided in 10-15 minute intervals if needed, but try and build up your endurance at least once a week.  Weight bearing exercise is also recommended twice weekly.  Eat a healthy diet with lots of vegetables, fruits and fiber.  "Colorful" foods have a lot of vitamins (ie green vegetables, tomatoes, red peppers, etc).  Limit sweet tea, regular sodas and alcoholic beverages, all of which has a lot of calories and sugar.  Up to 2 alcoholic drinks daily may be beneficial for men (unless trying to lose weight, watch sugars).  Drink a lot of water.  Sunscreen of at least SPF 30 should be used on all sun-exposed parts of the skin when outside between the hours of 10 am and 4 pm (not just when at beach or pool, but even with exercise, golf, tennis, and yard work!)  Use a sunscreen that says "broad spectrum" so it covers both UVA and UVB rays, and make sure to reapply every 1-2 hours.  Remember to change the batteries in your smoke detectors when changing your clock times in the spring and fall.  Use your seat belt every time you are in a car, and please drive safely and not be distracted with cell phones and texting while driving.   Mr. Ulch , Thank you for taking time to come for your Medicare Wellness Visit. I appreciate your ongoing commitment to your health goals. Please review the following plan we discussed and let me know if I can assist you in the future.   These are the goals we discussed: Goals    None      This is a list of the screening recommended for you and due dates:  Health Maintenance  Topic Date Due  . Complete foot exam   11/02/1960  . Eye exam for diabetics  11/02/1960  . Flu Shot  12/20/2015  . Hemoglobin A1C  01/07/2017  .  Colon Cancer Screening  07/20/2019  . Tetanus Vaccine  10/18/2021  .  Hepatitis C: One time screening is recommended by Center for Disease Control  (CDC) for  adults born from [birth year] through 1967.   Addressed  . Pneumonia vaccines  Completed   You don't have diabetes. Please ignore the recommendations above for diabetic foot and eye exams. A1c's should be checked yearly, doesn't need to be every 6 months.  I recommend getting the new shingles vaccine (Shingrix) when available. You will need to check with your insurance to see if it is covered, and if covered by Medicare Part D, you need to get from the pharmacy rather than our office.  It is a series of 2 injections, spaced 2 months apart.

## 2016-08-02 ENCOUNTER — Other Ambulatory Visit: Payer: Self-pay | Admitting: Family Medicine

## 2016-08-20 ENCOUNTER — Other Ambulatory Visit: Payer: Self-pay | Admitting: Family Medicine

## 2016-10-05 ENCOUNTER — Other Ambulatory Visit: Payer: Self-pay | Admitting: Family Medicine

## 2016-10-10 ENCOUNTER — Other Ambulatory Visit: Payer: Self-pay | Admitting: Family Medicine

## 2016-11-05 IMAGING — NM NM MISC PROCEDURE
6 series · 36 of 36 positions shown · non-contrast
Comparison: none

[Series 1: wbr_r-proj_st wbr rest · 6.40mm/px · 6 of 64 frames shown]
[frame 6/64]
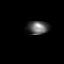
[frame 16/64]
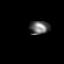
[frame 27/64]
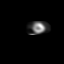
[frame 38/64]
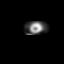
[frame 48/64]
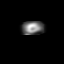
[frame 59/64]
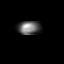

[Series 1: wbr rest · 6.40mm/px · 6 of 64 frames shown]
[frame 6/64]
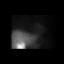
[frame 16/64]
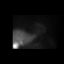
[frame 27/64]
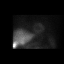
[frame 38/64]
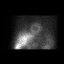
[frame 48/64]
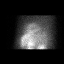
[frame 59/64]
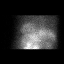

[Series 2: wbr stress-gsp · 6.40mm/px · 6 of 512 frames shown]
[frame 43/512]
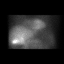
[frame 128/512]
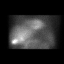
[frame 214/512]
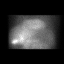
[frame 299/512]
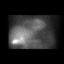
[frame 384/512]
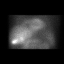
[frame 470/512]
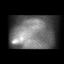

[Series 2: wbr_s-proj_st wbr stress-gsp · 6.40mm/px · 6 of 512 frames shown]
[frame 43/512]
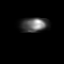
[frame 128/512]
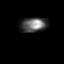
[frame 214/512]
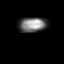
[frame 299/512]
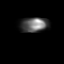
[frame 384/512]
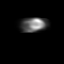
[frame 470/512]
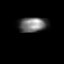

[Series 3: wbr_s-proj_st wbr stress-sum-em · 6.40mm/px · 6 of 64 frames shown]
[frame 6/64]
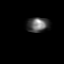
[frame 16/64]
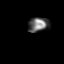
[frame 27/64]
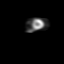
[frame 38/64]
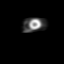
[frame 48/64]
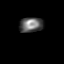
[frame 59/64]
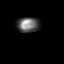

[Series 3: wbr stress-sum-em · 6.40mm/px · 6 of 64 frames shown]
[frame 6/64]
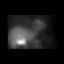
[frame 16/64]
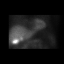
[frame 27/64]
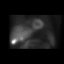
[frame 38/64]
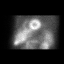
[frame 48/64]
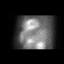
[frame 59/64]
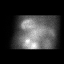

[36 of 36 positions shown; findings below may reference images not displayed]

Canned report from images found in remote index.

Refer to host system for actual result text.

## 2016-11-13 ENCOUNTER — Other Ambulatory Visit: Payer: Self-pay | Admitting: Family Medicine

## 2017-01-01 ENCOUNTER — Other Ambulatory Visit: Payer: Self-pay | Admitting: Family Medicine

## 2017-02-12 NOTE — Progress Notes (Signed)
  According to last visit (AWV), this was supposed to be fasting lab visit only.... Has future orders for fglu and LFTs  HTN: no headaches, dizziness. Compliant with meds. BP's at work run around 114-118/70 or less.  Hyperlipidemia follow-up: Patient is reportedly following a low-fat, low cholesterol diet. Compliant with medications and denies medication side effects. He had fasting labs prior to visit,see below.  Impaired fasting sugar: Denies polydipsia, polyuria.He lost weight, is getting regular exercise, and has cut back on alcohol, meal size and sweets and carbs. He has lost weight and feels great. Lab Results  Component Value Date   HGBA1C 5.5 07/10/2016   CAD--He had cardiolyte 10/2014, and saw Dr. Claiborne Billings. He is doing well without complaints. He denies any chest pain, DOE. Gets regular exercise.  ED: Doing well with just one tablet of generic sildenafil, with good results, rarely needed (uses it if it is going to be a "big night", very tired, or alcohol). He has an active sex life, and usually doesn't need meds.Marland Kitchen

## 2017-02-13 ENCOUNTER — Ambulatory Visit: Payer: Medicare Other

## 2017-02-13 DIAGNOSIS — Z5181 Encounter for therapeutic drug level monitoring: Secondary | ICD-10-CM | POA: Diagnosis not present

## 2017-02-13 DIAGNOSIS — R7301 Impaired fasting glucose: Secondary | ICD-10-CM

## 2017-02-15 LAB — TEST AUTHORIZATION

## 2017-02-15 LAB — EXTRA LAV TOP TUBE

## 2017-02-15 LAB — HEPATIC FUNCTION PANEL
AG Ratio: 2.9 (calc) — ABNORMAL HIGH (ref 1.0–2.5)
ALT: 31 U/L (ref 9–46)
AST: 27 U/L (ref 10–35)
Albumin: 4.3 g/dL (ref 3.6–5.1)
Alkaline phosphatase (APISO): 70 U/L (ref 40–115)
BILIRUBIN DIRECT: 0.2 mg/dL (ref 0.0–0.2)
BILIRUBIN INDIRECT: 0.5 mg/dL (ref 0.2–1.2)
BILIRUBIN TOTAL: 0.7 mg/dL (ref 0.2–1.2)
Globulin: 1.5 g/dL (calc) — ABNORMAL LOW (ref 1.9–3.7)
Total Protein: 5.8 g/dL — ABNORMAL LOW (ref 6.1–8.1)

## 2017-02-15 LAB — HEMOGLOBIN A1C W/OUT EAG: HEMOGLOBIN A1C: 5.5 %{Hb} (ref <5.7)

## 2017-02-15 LAB — GLUCOSE, RANDOM: GLUCOSE: 123 mg/dL — AB (ref 65–99)

## 2017-02-25 ENCOUNTER — Other Ambulatory Visit: Payer: Self-pay | Admitting: Family Medicine

## 2017-03-26 ENCOUNTER — Other Ambulatory Visit: Payer: Self-pay | Admitting: Family Medicine

## 2017-04-28 ENCOUNTER — Other Ambulatory Visit: Payer: Self-pay | Admitting: Family Medicine

## 2017-04-30 NOTE — Telephone Encounter (Signed)
Is this okay to refill? 

## 2017-05-17 ENCOUNTER — Other Ambulatory Visit: Payer: Self-pay | Admitting: Family Medicine

## 2017-05-22 ENCOUNTER — Other Ambulatory Visit: Payer: Self-pay | Admitting: Family Medicine

## 2017-06-16 ENCOUNTER — Other Ambulatory Visit: Payer: Self-pay | Admitting: Family Medicine

## 2017-06-26 ENCOUNTER — Encounter: Payer: Self-pay | Admitting: Family Medicine

## 2017-07-17 ENCOUNTER — Encounter: Payer: Self-pay | Admitting: Family Medicine

## 2017-07-18 ENCOUNTER — Other Ambulatory Visit: Payer: Self-pay | Admitting: *Deleted

## 2017-07-18 DIAGNOSIS — Z0000 Encounter for general adult medical examination without abnormal findings: Secondary | ICD-10-CM

## 2017-07-18 DIAGNOSIS — Z5181 Encounter for therapeutic drug level monitoring: Secondary | ICD-10-CM

## 2017-07-18 DIAGNOSIS — Z125 Encounter for screening for malignant neoplasm of prostate: Secondary | ICD-10-CM

## 2017-07-18 DIAGNOSIS — E7800 Pure hypercholesterolemia, unspecified: Secondary | ICD-10-CM

## 2017-07-19 ENCOUNTER — Other Ambulatory Visit: Payer: Self-pay | Admitting: Family Medicine

## 2017-07-19 DIAGNOSIS — B029 Zoster without complications: Secondary | ICD-10-CM

## 2017-07-19 HISTORY — DX: Zoster without complications: B02.9

## 2017-07-24 ENCOUNTER — Ambulatory Visit: Payer: Medicare Other | Admitting: Family Medicine

## 2017-07-30 ENCOUNTER — Encounter: Payer: Self-pay | Admitting: Family Medicine

## 2017-07-30 MED ORDER — VALACYCLOVIR HCL 1 G PO TABS: 1000.0000 mg | ORAL_TABLET | Freq: Three times a day (TID) | ORAL | 0 refills | Status: DC

## 2017-07-30 NOTE — Telephone Encounter (Signed)
Pt stopped by on his lunch hour. Having hyperesthesia at posterior scalp, into neck, and even into his left arm.  Rash is present (as seen in photos) at left posterior neck/shoulder (over trapezius), below left ear, and left anterior chest.  The papules/vesicles are more clearly seen under the ear, where he likely shaved over it.  He has h/o zostavax (not the new shingrix).  Therefore, treating for a mild shingles outbreak,since rash only started in the last 24 hours.

## 2017-08-01 DIAGNOSIS — Z5181 Encounter for therapeutic drug level monitoring: Secondary | ICD-10-CM | POA: Diagnosis not present

## 2017-08-01 DIAGNOSIS — E7800 Pure hypercholesterolemia, unspecified: Secondary | ICD-10-CM | POA: Diagnosis not present

## 2017-08-01 DIAGNOSIS — Z125 Encounter for screening for malignant neoplasm of prostate: Secondary | ICD-10-CM | POA: Diagnosis not present

## 2017-08-01 DIAGNOSIS — Z0000 Encounter for general adult medical examination without abnormal findings: Secondary | ICD-10-CM | POA: Diagnosis not present

## 2017-08-02 LAB — CBC WITH DIFFERENTIAL/PLATELET
Basophils Absolute: 0 10*3/uL (ref 0.0–0.2)
Basos: 0 %
EOS (ABSOLUTE): 0.1 10*3/uL (ref 0.0–0.4)
EOS: 3 %
Hematocrit: 42.1 % (ref 37.5–51.0)
Hemoglobin: 14.1 g/dL (ref 13.0–17.7)
IMMATURE GRANS (ABS): 0 10*3/uL (ref 0.0–0.1)
IMMATURE GRANULOCYTES: 0 %
LYMPHS: 11 %
Lymphocytes Absolute: 0.6 10*3/uL — ABNORMAL LOW (ref 0.7–3.1)
MCH: 30.3 pg (ref 26.6–33.0)
MCHC: 33.5 g/dL (ref 31.5–35.7)
MCV: 91 fL (ref 79–97)
MONOS ABS: 0.8 10*3/uL (ref 0.1–0.9)
Monocytes: 15 %
NEUTROS PCT: 71 %
Neutrophils Absolute: 3.8 10*3/uL (ref 1.4–7.0)
PLATELETS: 174 10*3/uL (ref 150–379)
RBC: 4.65 x10E6/uL (ref 4.14–5.80)
RDW: 13.4 % (ref 12.3–15.4)
WBC: 5.4 10*3/uL (ref 3.4–10.8)

## 2017-08-02 LAB — COMPREHENSIVE METABOLIC PANEL
ALK PHOS: 83 IU/L (ref 39–117)
ALT: 30 IU/L (ref 0–44)
AST: 27 IU/L (ref 0–40)
Albumin/Globulin Ratio: 2.7 — ABNORMAL HIGH (ref 1.2–2.2)
Albumin: 4.9 g/dL — ABNORMAL HIGH (ref 3.6–4.8)
BUN/Creatinine Ratio: 19 (ref 10–24)
BUN: 18 mg/dL (ref 8–27)
Bilirubin Total: 1 mg/dL (ref 0.0–1.2)
CALCIUM: 9.3 mg/dL (ref 8.6–10.2)
CO2: 24 mmol/L (ref 20–29)
CREATININE: 0.96 mg/dL (ref 0.76–1.27)
Chloride: 101 mmol/L (ref 96–106)
GFR calc Af Amer: 94 mL/min/{1.73_m2} (ref >59)
GFR calc non Af Amer: 81 mL/min/{1.73_m2} (ref >59)
GLOBULIN, TOTAL: 1.8 g/dL (ref 1.5–4.5)
Glucose: 106 mg/dL — ABNORMAL HIGH (ref 65–99)
POTASSIUM: 4.1 mmol/L (ref 3.5–5.2)
SODIUM: 144 mmol/L (ref 134–144)
Total Protein: 6.7 g/dL (ref 6.0–8.5)

## 2017-08-02 LAB — LIPID PANEL
CHOL/HDL RATIO: 2.8 ratio (ref 0.0–5.0)
Cholesterol, Total: 154 mg/dL (ref 100–199)
HDL: 55 mg/dL (ref >39)
LDL Calculated: 83 mg/dL (ref 0–99)
Triglycerides: 82 mg/dL (ref 0–149)
VLDL Cholesterol Cal: 16 mg/dL (ref 5–40)

## 2017-08-02 LAB — PSA: PROSTATE SPECIFIC AG, SERUM: 2.4 ng/mL (ref 0.0–4.0)

## 2017-08-06 ENCOUNTER — Other Ambulatory Visit: Payer: Self-pay | Admitting: Family Medicine

## 2017-08-06 DIAGNOSIS — E7800 Pure hypercholesterolemia, unspecified: Secondary | ICD-10-CM

## 2017-08-06 NOTE — Telephone Encounter (Signed)
Left message for pt to call me back .Marland Kitchen I see pt has an appt in 2 days. Waiting to know if he can wait until then to get refills
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
## 2017-08-08 ENCOUNTER — Encounter: Payer: Self-pay | Admitting: Family Medicine

## 2017-08-08 ENCOUNTER — Ambulatory Visit (INDEPENDENT_AMBULATORY_CARE_PROVIDER_SITE_OTHER): Payer: Medicare Other | Admitting: Family Medicine

## 2017-08-08 VITALS — BP 114/70 | HR 72 | Ht 71.0 in | Wt 220.8 lb

## 2017-08-08 DIAGNOSIS — N529 Male erectile dysfunction, unspecified: Secondary | ICD-10-CM

## 2017-08-08 DIAGNOSIS — I2510 Atherosclerotic heart disease of native coronary artery without angina pectoris: Secondary | ICD-10-CM | POA: Diagnosis not present

## 2017-08-08 DIAGNOSIS — E7800 Pure hypercholesterolemia, unspecified: Secondary | ICD-10-CM | POA: Diagnosis not present

## 2017-08-08 DIAGNOSIS — B029 Zoster without complications: Secondary | ICD-10-CM | POA: Diagnosis not present

## 2017-08-08 DIAGNOSIS — R7301 Impaired fasting glucose: Secondary | ICD-10-CM | POA: Diagnosis not present

## 2017-08-08 DIAGNOSIS — Z5181 Encounter for therapeutic drug level monitoring: Secondary | ICD-10-CM

## 2017-08-08 DIAGNOSIS — I10 Essential (primary) hypertension: Secondary | ICD-10-CM

## 2017-08-08 DIAGNOSIS — Z0000 Encounter for general adult medical examination without abnormal findings: Secondary | ICD-10-CM

## 2017-08-08 MED ORDER — NIACIN ER (ANTIHYPERLIPIDEMIC) 500 MG PO TBCR: 500.0000 mg | EXTENDED_RELEASE_TABLET | Freq: Every day | ORAL | 3 refills | Status: DC

## 2017-08-08 MED ORDER — LISINOPRIL-HYDROCHLOROTHIAZIDE 20-12.5 MG PO TABS: 0.5000 | ORAL_TABLET | Freq: Every day | ORAL | 3 refills | Status: DC

## 2017-08-08 MED ORDER — AMLODIPINE BESYLATE 5 MG PO TABS: 5.0000 mg | ORAL_TABLET | Freq: Every day | ORAL | 3 refills | Status: DC

## 2017-08-08 MED ORDER — ATORVASTATIN CALCIUM 40 MG PO TABS: ORAL_TABLET | ORAL | 3 refills | Status: DC

## 2017-08-08 NOTE — Patient Instructions (Signed)
  HEALTH MAINTENANCE RECOMMENDATIONS:  It is recommended that you get at least 30 minutes of aerobic exercise at least 5 days/week (for weight loss, you may need as much as 60-90 minutes). This can be any activity that gets your heart rate up. This can be divided in 10-15 minute intervals if needed, but try and build up your endurance at least once a week.  Weight bearing exercise is also recommended twice weekly.  Eat a healthy diet with lots of vegetables, fruits and fiber.  "Colorful" foods have a lot of vitamins (ie green vegetables, tomatoes, red peppers, etc).  Limit sweet tea, regular sodas and alcoholic beverages, all of which has a lot of calories and sugar.  Up to 2 alcoholic drinks daily may be beneficial for men (unless trying to lose weight, watch sugars).  Drink a lot of water.  Sunscreen of at least SPF 30 should be used on all sun-exposed parts of the skin when outside between the hours of 10 am and 4 pm (not just when at beach or pool, but even with exercise, golf, tennis, and yard work!)  Use a sunscreen that says "broad spectrum" so it covers both UVA and UVB rays, and make sure to reapply every 1-2 hours.  Remember to change the batteries in your smoke detectors when changing your clock times in the spring and fall.  Use your seat belt every time you are in a car, and please drive safely and not be distracted with cell phones and texting while driving.   Jackson Reed , Thank you for taking time to come for your Medicare Wellness Visit. I appreciate your ongoing commitment to your health goals. Please review the following plan we discussed and let me know if I can assist you in the future.   These are the goals we discussed: Goals    None      This is a list of the screening recommended for you and due dates:  Health Maintenance  Topic Date Due  . Complete foot exam   11/02/1960  . Eye exam for diabetics  11/02/1960  . Hemoglobin A1C  08/13/2017  . Colon Cancer Screening   07/20/2019  . Tetanus Vaccine  10/18/2021  . Flu Shot  Completed  .  Hepatitis C: One time screening is recommended by Center for Disease Control  (CDC) for  adults born from [birth year] through 1967.   Completed  . Pneumonia vaccines  Completed   I recommend getting the new shingles vaccine (Shingrix). You will need to check with your insurance to see if it is covered, and if covered by Medicare Part D, you need to get from the pharmacy rather than our office.  It is a series of 2 injections, spaced 2 months apart. There is no rush to get this, since you recently had shingles--in the next 6-12 months would be fine, not sooner.  Ignore the recommendations above for diabetes (foot exam, A1c, eye exam)

## 2017-08-09 ENCOUNTER — Encounter: Payer: Self-pay | Admitting: Family Medicine

## 2017-10-04 ENCOUNTER — Other Ambulatory Visit: Payer: Self-pay | Admitting: Family Medicine

## 2018-01-07 ENCOUNTER — Other Ambulatory Visit: Payer: Self-pay | Admitting: Family Medicine

## 2018-02-03 ENCOUNTER — Telehealth: Payer: Self-pay | Admitting: Family Medicine

## 2018-02-03 MED ORDER — PINDOLOL 5 MG PO TABS: 2.5000 mg | ORAL_TABLET | Freq: Every day | ORAL | 1 refills | Status: DC

## 2018-02-03 NOTE — Telephone Encounter (Signed)
[facility] req refill on Pindolol tab 5mg 

## 2018-02-03 NOTE — Telephone Encounter (Signed)
Done

## 2018-03-03 ENCOUNTER — Encounter: Payer: Self-pay | Admitting: Family Medicine

## 2018-03-10 DIAGNOSIS — M25552 Pain in left hip: Secondary | ICD-10-CM | POA: Diagnosis not present

## 2018-03-10 DIAGNOSIS — M545 Low back pain: Secondary | ICD-10-CM | POA: Diagnosis not present

## 2018-03-10 DIAGNOSIS — M25651 Stiffness of right hip, not elsewhere classified: Secondary | ICD-10-CM | POA: Diagnosis not present

## 2018-03-10 DIAGNOSIS — M25652 Stiffness of left hip, not elsewhere classified: Secondary | ICD-10-CM | POA: Diagnosis not present

## 2018-03-24 DIAGNOSIS — M25552 Pain in left hip: Secondary | ICD-10-CM | POA: Diagnosis not present

## 2018-03-24 DIAGNOSIS — M25652 Stiffness of left hip, not elsewhere classified: Secondary | ICD-10-CM | POA: Diagnosis not present

## 2018-03-24 DIAGNOSIS — M25651 Stiffness of right hip, not elsewhere classified: Secondary | ICD-10-CM | POA: Diagnosis not present

## 2018-03-24 DIAGNOSIS — M545 Low back pain: Secondary | ICD-10-CM | POA: Diagnosis not present

## 2018-04-07 DIAGNOSIS — M545 Low back pain: Secondary | ICD-10-CM | POA: Diagnosis not present

## 2018-04-07 DIAGNOSIS — M25552 Pain in left hip: Secondary | ICD-10-CM | POA: Diagnosis not present

## 2018-04-07 DIAGNOSIS — M25652 Stiffness of left hip, not elsewhere classified: Secondary | ICD-10-CM | POA: Diagnosis not present

## 2018-04-07 DIAGNOSIS — M25651 Stiffness of right hip, not elsewhere classified: Secondary | ICD-10-CM | POA: Diagnosis not present

## 2018-04-28 DIAGNOSIS — M25552 Pain in left hip: Secondary | ICD-10-CM | POA: Diagnosis not present

## 2018-04-28 DIAGNOSIS — M545 Low back pain: Secondary | ICD-10-CM | POA: Diagnosis not present

## 2018-04-28 DIAGNOSIS — M25652 Stiffness of left hip, not elsewhere classified: Secondary | ICD-10-CM | POA: Diagnosis not present

## 2018-04-28 DIAGNOSIS — M25651 Stiffness of right hip, not elsewhere classified: Secondary | ICD-10-CM | POA: Diagnosis not present

## 2018-05-26 DIAGNOSIS — M545 Low back pain: Secondary | ICD-10-CM | POA: Diagnosis not present

## 2018-05-26 DIAGNOSIS — M25552 Pain in left hip: Secondary | ICD-10-CM | POA: Diagnosis not present

## 2018-05-26 DIAGNOSIS — M25652 Stiffness of left hip, not elsewhere classified: Secondary | ICD-10-CM | POA: Diagnosis not present

## 2018-05-26 DIAGNOSIS — M25651 Stiffness of right hip, not elsewhere classified: Secondary | ICD-10-CM | POA: Diagnosis not present

## 2018-06-09 ENCOUNTER — Other Ambulatory Visit: Payer: Self-pay | Admitting: Family Medicine

## 2018-06-09 DIAGNOSIS — M25652 Stiffness of left hip, not elsewhere classified: Secondary | ICD-10-CM | POA: Diagnosis not present

## 2018-06-09 DIAGNOSIS — M545 Low back pain: Secondary | ICD-10-CM | POA: Diagnosis not present

## 2018-06-09 DIAGNOSIS — M25552 Pain in left hip: Secondary | ICD-10-CM | POA: Diagnosis not present

## 2018-06-09 DIAGNOSIS — M25651 Stiffness of right hip, not elsewhere classified: Secondary | ICD-10-CM | POA: Diagnosis not present

## 2018-06-12 ENCOUNTER — Encounter: Payer: Self-pay | Admitting: Family Medicine

## 2018-06-12 ENCOUNTER — Other Ambulatory Visit: Payer: Self-pay | Admitting: *Deleted

## 2018-06-12 DIAGNOSIS — I10 Essential (primary) hypertension: Secondary | ICD-10-CM

## 2018-06-12 DIAGNOSIS — E7800 Pure hypercholesterolemia, unspecified: Secondary | ICD-10-CM

## 2018-06-12 MED ORDER — PINDOLOL 5 MG PO TABS: 2.5000 mg | ORAL_TABLET | Freq: Every day | ORAL | 0 refills | Status: DC

## 2018-06-12 MED ORDER — ATORVASTATIN CALCIUM 40 MG PO TABS: ORAL_TABLET | ORAL | 0 refills | Status: DC

## 2018-06-12 MED ORDER — EZETIMIBE 10 MG PO TABS: 10.0000 mg | ORAL_TABLET | Freq: Every day | ORAL | 0 refills | Status: DC

## 2018-06-12 MED ORDER — LISINOPRIL-HYDROCHLOROTHIAZIDE 20-12.5 MG PO TABS: 0.5000 | ORAL_TABLET | Freq: Every day | ORAL | 0 refills | Status: DC

## 2018-06-12 MED ORDER — NIACIN ER (ANTIHYPERLIPIDEMIC) 500 MG PO TBCR: 500.0000 mg | EXTENDED_RELEASE_TABLET | Freq: Every day | ORAL | 0 refills | Status: DC

## 2018-06-12 MED ORDER — AMLODIPINE BESYLATE 5 MG PO TABS: 5.0000 mg | ORAL_TABLET | Freq: Every day | ORAL | 0 refills | Status: DC

## 2018-06-13 ENCOUNTER — Telehealth: Payer: Self-pay

## 2018-06-13 NOTE — Telephone Encounter (Signed)
[facility] has sent a fax stating Pindolol is on back order and an alternate medication will need to be sent.

## 2018-06-13 NOTE — Telephone Encounter (Signed)
Patient stated he does not need Pindolol at this time. Stated he has a month worth and he'll call back when he needs it.

## 2018-06-13 NOTE — Telephone Encounter (Signed)
No--I'm not changing his medication because they don't currently have it available.  He should try and get it from another pharmacy until they have it available (rather than switching meds)

## 2018-06-19 ENCOUNTER — Encounter: Payer: Self-pay | Admitting: Family Medicine

## 2018-06-19 ENCOUNTER — Other Ambulatory Visit: Payer: Self-pay | Admitting: *Deleted

## 2018-06-19 MED ORDER — PINDOLOL 5 MG PO TABS: 2.5000 mg | ORAL_TABLET | Freq: Every day | ORAL | 0 refills | Status: DC

## 2018-06-29 ENCOUNTER — Other Ambulatory Visit: Payer: Self-pay | Admitting: Family Medicine

## 2018-06-30 NOTE — Telephone Encounter (Signed)
Is this okay to refill? 

## 2018-07-03 ENCOUNTER — Encounter: Payer: Self-pay | Admitting: Family Medicine

## 2018-07-04 ENCOUNTER — Telehealth: Payer: Self-pay

## 2018-07-04 NOTE — Telephone Encounter (Signed)
Attempted to contact Jackson Reed, as he would like a few test ran, he has not seen Dr.Kelly since 2015, Dr.Kelly would like him to come in for an office visit, I notified Dr.Hamme to call back to schedule.  Gave call back number.

## 2018-07-07 DIAGNOSIS — M545 Low back pain: Secondary | ICD-10-CM | POA: Diagnosis not present

## 2018-07-07 DIAGNOSIS — M25651 Stiffness of right hip, not elsewhere classified: Secondary | ICD-10-CM | POA: Diagnosis not present

## 2018-07-07 DIAGNOSIS — M25652 Stiffness of left hip, not elsewhere classified: Secondary | ICD-10-CM | POA: Diagnosis not present

## 2018-07-07 DIAGNOSIS — M25552 Pain in left hip: Secondary | ICD-10-CM | POA: Diagnosis not present

## 2018-08-05 ENCOUNTER — Encounter: Payer: Self-pay | Admitting: Family Medicine

## 2018-08-06 ENCOUNTER — Encounter: Payer: Self-pay | Admitting: Family Medicine

## 2018-08-06 ENCOUNTER — Other Ambulatory Visit: Payer: Self-pay | Admitting: Family Medicine

## 2018-08-06 DIAGNOSIS — I10 Essential (primary) hypertension: Secondary | ICD-10-CM

## 2018-08-06 DIAGNOSIS — E7800 Pure hypercholesterolemia, unspecified: Secondary | ICD-10-CM

## 2018-08-11 ENCOUNTER — Ambulatory Visit: Payer: Medicare Other | Admitting: Cardiovascular Disease

## 2018-08-13 NOTE — Patient Instructions (Addendum)
  HEALTH MAINTENANCE RECOMMENDATIONS:  It is recommended that you get at least 30 minutes of aerobic exercise at least 5 days/week (for weight loss, you may need as much as 60-90 minutes). This can be any activity that gets your heart rate up. This can be divided in 10-15 minute intervals if needed, but try and build up your endurance at least once a week.  Weight bearing exercise is also recommended twice weekly.  Eat a healthy diet with lots of vegetables, fruits and fiber.  "Colorful" foods have a lot of vitamins (ie green vegetables, tomatoes, red peppers, etc).  Limit sweet tea, regular sodas and alcoholic beverages, all of which has a lot of calories and sugar.  Up to 2 alcoholic drinks daily may be beneficial for men (unless trying to lose weight, watch sugars).  Drink a lot of water.  Sunscreen of at least SPF 30 should be used on all sun-exposed parts of the skin when outside between the hours of 10 am and 4 pm (not just when at beach or pool, but even with exercise, golf, tennis, and yard work!)  Use a sunscreen that says "broad spectrum" so it covers both UVA and UVB rays, and make sure to reapply every 1-2 hours.  Remember to change the batteries in your smoke detectors when changing your clock times in the spring and fall.  Use your seat belt every time you are in a car, and please drive safely and not be distracted with cell phones and texting while driving.    Dr. Sabra Heck, Thank you for taking time to come for your Medicare Wellness Visit. I appreciate your ongoing commitment to your health goals. Please review the following plan we discussed and let me know if I can assist you in the future.   This is a list of the screening recommended for you and due dates:  Health Maintenance  Topic Date Due  . Colon Cancer Screening  07/20/2019  . Tetanus Vaccine  10/18/2021  . Flu Shot  Completed  .  Hepatitis C: One time screening is recommended by Center for Disease Control  (CDC) for   adults born from [birth year] through 1967.   Completed  . Pneumonia vaccines  Completed   Colon cancer screening is due again next year. Options include colonoscopy or Cologuard.  I recommend getting the new shingles vaccine (Shingrix). It is usually covered by Medicare Part D, your pharmacy benefit, so you need to get from the pharmacy rather than our  Office. It is a series of 2 injections, spaced 2 months apart.

## 2018-08-13 NOTE — Progress Notes (Signed)
Chief Complaint  Patient presents with  . Medicare Wellness    annual wellness visit. No other concerns.     Jackson Reed is a 68 y.o. male who presents for annual wellness visit and follow-up on chronic medical conditions. He has no specific questions or concerns.  Visit is performed via video (FaceTime).   Visit length 44 minutes.   CAD--He is under the care of Dr. Claiborne Billings. He was due to see him, but rescheduled to a later date (due to pandemic).  He exercises very regularly, and denies problems.He denies any chest pain, DOE.   HTN: no headaches, dizziness. Compliant with meds. BP's at work run around 120/70. Pulse is often upper 40's-50's.  He often waits to take his beta blocker until after his workout (so that he can get his heartrate up, and tolerate the exercise better).  Hyperlipidemia follow-up: Patient is reportedly following a low-fat, low cholesterol diet. Last year, he had cut back his lipitor to 1/2 tablet due to muscle aches. Lab Results  Component Value Date   CHOL 154 08/01/2017   HDL 55 08/01/2017   LDLCALC 83 08/01/2017   TRIG 82 08/01/2017   CHOLHDL 2.8 08/01/2017  He was advised last year to continue lipitor--try taking the full tablet every other day (or at least 3x/week), and 1/2 tablet other days, along with continuing the zetia. Goal is LDL <70.  He is currently taking 1m of lipitor every day, along with Zetia, and tolerating it well.  He finds that since he recently started exercising more (after January labs, see below), that his muscle stiffness resolved.  Lipids were last done 12/02/2017 (done elsewhere, pt gave uKorearesults, but will drop off to be scanned in): Total cholesterol 149, TG 78, LDLP1300, LDL-C 76, HDL-c 57.  Small LDL-P 866 (H)   Impaired fasting sugar: Last A1c was 5.8 in January. He made changes since getting those results-- Exercises on peleton daily since Limiting wine to weekends only, limited amounts. Lost 8-10 pounds since  then Limits carbs/sweets  ED: Sporadic. Doing well with just 1-2 tablets of generic sildenafil, with good results, rarely needed.    Immunization History  Administered Date(s) Administered  . Influenza, High Dose Seasonal PF 02/07/2017  . Influenza-Unspecified 02/04/2014, 02/05/2015, 02/01/2018  . Pneumococcal Conjugate-13 07/07/2014  . Pneumococcal Polysaccharide-23 07/13/2015  . Tdap 10/19/2011  . Zoster 12/10/2012   Last colonoscopy: 07/2009, Dr. MEarlean Shawl Last PSA:  2.9 on recent labs 06/2018 (scanned labs) Dentist: regularly  Ophtho: yearly (Charlene Brookeand Dr. SLucita Ferrara Exercise: daily--[place] 30-45 min class, [facility]; [place] work[place]outs when in [place]   Other doctors caring for patient include: Cardiology: Dr. KClaiborne Billings(rescheduled visit for later) Dentist: Dr. MDeanna ArtisOphtho: Dr. SLucita Ferrara optometry SJodene Nam(past due) Derm: Dr. SDerrel Nip(GBuenaderm), sees prn only GI: Dr. MEarlean ShawlDr. JLauris Chroman(essential wellness)  Depression screen: negative Fall Screen: negative Functional Status Screen: unremarkable Mini-Cog screen: score of 5 (normal)--clock shown through video See screens in epic  End of Life Discussion: Patient hasa living will and medical power of attorney; hasn't given uKoreacopies yet.  PMH, PSH, SH, FH reviewed, no reported changes per patient.  Outpatient Encounter Medications as of 08/14/2018  Medication Sig Note  . amLODipine (NORVASC) 5 MG tablet Take 1 tablet (5 mg total) by mouth daily.   . APPLE CIDER VINEGAR PO Take 30 mLs by mouth daily.   . Ascorbic Acid (VITAMIN C) 1000 MG tablet Take 1,000 mg by  mouth daily.     Marland Kitchen aspirin 81 MG tablet Take 81 mg by mouth daily.     Marland Kitchen atorvastatin (LIPITOR) 40 MG tablet Take 1 tablet by mouth  every day   . b complex vitamins tablet Take 1 tablet by mouth daily.     . Coenzyme Q10 300 MG CAPS Take 1 capsule by mouth daily.     Marland Kitchen ezetimibe (ZETIA) 10 MG tablet Take 1  tablet (10 mg total) by mouth daily.   . Fiber TABS Take 1 tablet by mouth daily.     Marland Kitchen lisinopril-hydrochlorothiazide (PRINZIDE,ZESTORETIC) 20-12.5 MG tablet Take 0.5 tablets by mouth daily.   . Magnesium 500 MG CAPS Take 1 capsule by mouth daily.   . niacin (NIASPAN) 500 MG CR tablet Take 1 tablet (500 mg total) by mouth at bedtime.   . OMEGA 3 1200 MG CAPS Take 1 capsule by mouth daily.    . pindolol (VISKEN) 5 MG tablet Take 0.5 tablets (2.5 mg total) by mouth daily.   . Pomegranate, Punica granatum, (POMEGRANATE EXTRACT PO) Take 2 oz by mouth daily.   . vitamin E 400 UNIT capsule Take 400 Units by mouth daily.     . sildenafil (REVATIO) 20 MG tablet TAKE 2-4 TABLETS ONCE DAILY AS NEEDED FOR ED (Patient not taking: Reported on 08/14/2018) 08/14/2018: Uses 1-2 pills occasionally, infrequent   No facility-administered encounter medications on file as of 08/14/2018.     No Known Allergies  ROS: The patient denies anorexia, fever, headaches, vision loss (wears glasses), decreased hearing, ear pain, hoarseness, chest pain, palpitations, dizziness, syncope, dyspnea on exertion, cough, swelling, nausea, vomiting, diarrhea, constipation, abdominal pain, melena, hematochezia, indigestion/heartburn, hematuria, incontinence, nocturia, weakened urine stream, dysuria, genital lesions, joint pains, numbness, tingling, weakness, tremor, suspicious skin lesions, depression, anxiety, abnormal bleeding/bruising, or enlarged lymph nodes. +ED, intermittent/mild.   PHYSICAL EXAM:  BP 117/60   Pulse (!) 49   Ht 5' 11.5" (1.816 m)   Wt 210 lb (95.3 kg)   BMI 28.88 kg/m   BP checked at work today; weighed at home. Pulse per Apple Watch at time of visit was 60  Wt Readings from Last 3 Encounters:  08/14/18 210 lb (95.3 kg)  08/08/17 220 lb 12.8 oz (100.2 kg)  07/18/16 208 lb (94.3 kg)   Limited physical findings, video visit only. He appears stated age, in good spirits, in no distress He is alert,  oriented, cranial nerves grossly intact. Normal mood, affect, hygiene, grooming, eye contact, speech  labs 06/17/2018 nl CBC, c-met (x glu 102, Na 146), A1c 5.8, TSH, PSA 2.9, Vit D 31.2.  salivary steroids nl (essential health/wellness) Lipids from 11/2017 reported by pt today--see HPI   ASSESSMENT/PLAN:  Medicare annual wellness visit, subsequent  Pure hypercholesterolemia - reviewed goal of LDL<70.  Recommend recheck sometime in the next 3 months  Essential hypertension, benign - well controlled on current regimen  Coronary artery disease involving native coronary artery of native heart without angina pectoris - stable, asymptomatic. Cont ASA, BB, regular exercise. f/u with cardiologist as planned  Impaired fasting glucose - Recommend repeat A1c with next labs (in the next 3 months or so); cont regular exercise, limited sugar, weight loss  Erectile dysfunction, unspecified erectile dysfunction type  shingrix recommended (episode of shingles was over a year ago)  Fasting glucose, A1c and lipids recommended sometime in the next 3 months. He will let us know when back in down. Also recommended OV for physical portion of exam including  prostate check.  Asked to drop off copies of his living will and power of attorney to be scanned into his chart. Reviewed MOST form over phone--continues to be full code, full care, agreeable to ABX, IV fluids and tube feeds longterm if indicated.  Discussed PSA screening (risks/benefits), recommended at least 30 minutes of aerobic activity at least 5 days/week, weight-bearing exercise at least 2x/week; proper sunscreen use reviewed; healthy diet and alcohol recommendations (less than or equal to 2 drinks/day) reviewed; regular seatbelt use; changing batteries in smoke detectors. Immunization recommendations discussed. Continue yearly flu shots. Shingrix recommended, to get from pharmacy. Colon cancer screening recommendations reviewed--due next year.  Consider repeat colonoscopy vs Cologuard discussed.    Medicare Attestation I have personally reviewed: The patient's medical and social history Their use of alcohol, tobacco or illicit drugs Their current medications and supplements The patient's functional ability including ADLs,fall risks, home safety risks, cognitive, and hearing and visual impairment Diet and physical activities Evidence for depression or mood disorders  The patient's weight, height and BMI have been recorded in the chart (not directly measured in office today, due to being virtual visit, due to COVID-19 pandemic).  I have made referrals, counseling, and provided education to the patient based on review of the above and I have provided the patient with a written personalized care plan for preventive services.

## 2018-08-14 ENCOUNTER — Other Ambulatory Visit: Payer: Self-pay

## 2018-08-14 ENCOUNTER — Ambulatory Visit (INDEPENDENT_AMBULATORY_CARE_PROVIDER_SITE_OTHER): Payer: Medicare Other | Admitting: Family Medicine

## 2018-08-14 ENCOUNTER — Encounter: Payer: Self-pay | Admitting: Family Medicine

## 2018-08-14 VITALS — BP 117/60 | HR 49 | Ht 71.5 in | Wt 210.0 lb

## 2018-08-14 DIAGNOSIS — I10 Essential (primary) hypertension: Secondary | ICD-10-CM

## 2018-08-14 DIAGNOSIS — Z0000 Encounter for general adult medical examination without abnormal findings: Secondary | ICD-10-CM

## 2018-08-14 DIAGNOSIS — N529 Male erectile dysfunction, unspecified: Secondary | ICD-10-CM | POA: Diagnosis not present

## 2018-08-14 DIAGNOSIS — R7301 Impaired fasting glucose: Secondary | ICD-10-CM

## 2018-08-14 DIAGNOSIS — E7800 Pure hypercholesterolemia, unspecified: Secondary | ICD-10-CM

## 2018-08-14 DIAGNOSIS — I2510 Atherosclerotic heart disease of native coronary artery without angina pectoris: Secondary | ICD-10-CM

## 2018-08-16 ENCOUNTER — Encounter: Payer: Self-pay | Admitting: Family Medicine

## 2018-08-16 DIAGNOSIS — E7800 Pure hypercholesterolemia, unspecified: Secondary | ICD-10-CM

## 2018-08-16 DIAGNOSIS — I10 Essential (primary) hypertension: Secondary | ICD-10-CM

## 2018-08-17 MED ORDER — LISINOPRIL-HYDROCHLOROTHIAZIDE 20-12.5 MG PO TABS: 0.5000 | ORAL_TABLET | Freq: Every day | ORAL | 1 refills | Status: DC

## 2018-08-17 MED ORDER — ATORVASTATIN CALCIUM 40 MG PO TABS: ORAL_TABLET | ORAL | 1 refills | Status: DC

## 2018-08-17 MED ORDER — NIACIN ER (ANTIHYPERLIPIDEMIC) 500 MG PO TBCR: 500.0000 mg | EXTENDED_RELEASE_TABLET | Freq: Every day | ORAL | 1 refills | Status: AC

## 2018-08-17 MED ORDER — AMLODIPINE BESYLATE 5 MG PO TABS: 5.0000 mg | ORAL_TABLET | Freq: Every day | ORAL | 1 refills | Status: AC

## 2018-08-18 ENCOUNTER — Encounter: Payer: Self-pay | Admitting: Family Medicine

## 2018-08-26 ENCOUNTER — Encounter: Payer: Self-pay | Admitting: Family Medicine

## 2018-09-02 ENCOUNTER — Other Ambulatory Visit: Payer: Self-pay | Admitting: Family Medicine

## 2018-09-15 ENCOUNTER — Ambulatory Visit: Payer: Medicare Other | Admitting: Cardiovascular Disease

## 2018-09-29 ENCOUNTER — Other Ambulatory Visit: Payer: Self-pay | Admitting: Family Medicine

## 2018-09-29 NOTE — Telephone Encounter (Signed)
Is this okay to refill? 

## 2018-09-30 ENCOUNTER — Other Ambulatory Visit: Payer: Self-pay | Admitting: Family Medicine

## 2018-11-17 ENCOUNTER — Encounter: Payer: Self-pay | Admitting: Family Medicine

## 2018-11-17 DIAGNOSIS — M545 Low back pain: Secondary | ICD-10-CM | POA: Diagnosis not present

## 2018-11-17 DIAGNOSIS — M542 Cervicalgia: Secondary | ICD-10-CM | POA: Diagnosis not present

## 2018-11-18 ENCOUNTER — Encounter: Payer: Self-pay | Admitting: Family Medicine

## 2018-11-19 DIAGNOSIS — M545 Low back pain: Secondary | ICD-10-CM | POA: Diagnosis not present

## 2018-11-19 DIAGNOSIS — M542 Cervicalgia: Secondary | ICD-10-CM | POA: Diagnosis not present

## 2018-11-21 DIAGNOSIS — M542 Cervicalgia: Secondary | ICD-10-CM | POA: Diagnosis not present

## 2018-11-21 DIAGNOSIS — M545 Low back pain: Secondary | ICD-10-CM | POA: Diagnosis not present

## 2018-11-26 DIAGNOSIS — M545 Low back pain: Secondary | ICD-10-CM | POA: Diagnosis not present

## 2018-11-26 DIAGNOSIS — M542 Cervicalgia: Secondary | ICD-10-CM | POA: Diagnosis not present

## 2018-11-28 ENCOUNTER — Encounter: Payer: Self-pay | Admitting: Family Medicine

## 2018-11-29 ENCOUNTER — Encounter: Payer: Self-pay | Admitting: Family Medicine

## 2018-12-03 ENCOUNTER — Encounter: Payer: Self-pay | Admitting: Family Medicine

## 2018-12-03 DIAGNOSIS — M542 Cervicalgia: Secondary | ICD-10-CM | POA: Diagnosis not present

## 2018-12-03 DIAGNOSIS — M545 Low back pain: Secondary | ICD-10-CM | POA: Diagnosis not present

## 2018-12-03 NOTE — Progress Notes (Signed)
Start time: 1:38 End time: 1:53  Virtual Visit via Video Note  I connected with Ceasar Mons on 12/04/2018 by a video enabled telemedicine application and verified that I am speaking with the correct person using two identifiers.  Location: Patient: at his home in [facility] Provider: office   I discussed the limitations of evaluation and management by telemedicine and the availability of in person appointments. The patient expressed understanding and agreed to proceed.  History of Present Illness:  Chief Complaint  Patient presents with  . Consult    VIRTUAL bp issues. Consult with Dr. Tomi Bamberger to possibly change medication.     Patient presents to discuss his blood pressure.  Since losing weight, they have been running low.  He sent MyChart message the other day stating BP running 84/62.  He reports having some dizziness when getting up from sofa to chair, or to standing, lasts about 15 seconds.  Admits that he isn't hydrating as well as he should be, sometimes notices his urine is darker. He reports having this short-lived dizziness about once or twice a day over the last week or two.  He takes his blood pressure medications after exercising (to ensure he can get his heart rate up), around 9:30-10.  He has no problems while working out. Pulse gets up to 130's with his workouts.  He was asked to send a list of his BP's over the last month, but he hasn't been writing them down.  He sent in BP's laying/sitting/standing from yesterday--BP was highest with standing.  Pulse remained low throughout--see MyChart message.  PMH, PSH, SH reviewed  BP medications currently include amlodipine 5mg , lisinopril HCT 20/12.5, taking just 1/2 tablet daily, and Visken, taking 1/2 of 5mg  tablet.  No Known Allergies  ROS:  No fever, chills, URI symptoms, chest pain, shortness of breath, headaches,  Dizziness per HPI.  No edema or other concerns.    Observations/Objective:  Ht 5' 11.5" (1.816 m)    Wt 195 lb (88.5 kg)   BMI 26.82 kg/m   He didn't have a chance to check his BP today (see MyChart message for yesterday's values, ranging 109-130/64-65, pulse 48-52 (laying was 120, sitting 109, standing 701 systolic)  Wt Readings from Last 3 Encounters:  12/04/18 195 lb (88.5 kg)  08/14/18 210 lb (95.3 kg)  08/08/17 220 lb 12.8 oz (100.2 kg)   Well appearing, pleasant male, in good spirits, in no distress. He is alert, oriented, cranial nerves intact.   Normal mood, affect, grooming. Significant weight loss noted in abdomen   Assessment and Plan:  Essential hypertension, benign - BP's lower than they need to be, some short-lived orthostasis.  Will eliminate the diuretic; increase hydration. If BP's remain low, cut amlodipine dose - Plan: lisinopril (ZESTRIL) 10 MG tablet   Stop 1/2 tablet of lisinopril HCT and change to 10mg  lisinopril. Continue other meds  Follow Up Instructions:    I discussed the assessment and treatment plan with the patient. The patient was provided an opportunity to ask questions and all were answered. The patient agreed with the plan and demonstrated an understanding of the instructions.   The patient was advised to call back or seek an in-person evaluation if the symptoms worsen or if the condition fails to improve as anticipated.  I provided 15 minutes of non-face-to-face time during this encounter.   Vikki Ports, MD

## 2018-12-04 ENCOUNTER — Encounter: Payer: Self-pay | Admitting: Family Medicine

## 2018-12-04 ENCOUNTER — Other Ambulatory Visit: Payer: Self-pay

## 2018-12-04 ENCOUNTER — Ambulatory Visit (INDEPENDENT_AMBULATORY_CARE_PROVIDER_SITE_OTHER): Payer: Medicare Other | Admitting: Family Medicine

## 2018-12-04 VITALS — Ht 71.5 in | Wt 195.0 lb

## 2018-12-04 DIAGNOSIS — I10 Essential (primary) hypertension: Secondary | ICD-10-CM

## 2018-12-04 DIAGNOSIS — I2510 Atherosclerotic heart disease of native coronary artery without angina pectoris: Secondary | ICD-10-CM | POA: Diagnosis not present

## 2018-12-04 MED ORDER — LISINOPRIL 10 MG PO TABS: 10.0000 mg | ORAL_TABLET | Freq: Every day | ORAL | 1 refills | Status: AC

## 2018-12-04 NOTE — Patient Instructions (Addendum)
Please stay well hydrated--aim to have your urine very pale yellow. We are changing your lisinopril HCT to plain 10mg  of lisinopril (eliminating the 6.25mg  of HCTZ). You will be taking the full tablet of lisinopril (since it is already the lower dose).  Monitor your blood pressure regularly, and send a list within the next few weeks.  If BP remains consistently low, next step would be to cut the dose of amlodipine, but let's see what getting rid of the diuretic does first.  Keep up the regular exercise and maintain that weight loss--retirement looks good on you!

## 2018-12-12 ENCOUNTER — Encounter: Payer: Self-pay | Admitting: Family Medicine

## 2018-12-12 DIAGNOSIS — M542 Cervicalgia: Secondary | ICD-10-CM | POA: Diagnosis not present

## 2018-12-12 DIAGNOSIS — M545 Low back pain: Secondary | ICD-10-CM | POA: Diagnosis not present

## 2018-12-23 ENCOUNTER — Other Ambulatory Visit: Payer: Self-pay | Admitting: Family Medicine

## 2018-12-25 ENCOUNTER — Telehealth: Payer: Self-pay | Admitting: Family Medicine

## 2018-12-25 NOTE — Telephone Encounter (Signed)
Pt called and is going to be in town on august the 17th and is wanting to know if you need him to come in for a appt for anything, for labs etc since his well visit was a virtual visit . Pt wanted to check with you.since he will be in town  Pt can be reached at [phone]

## 2018-12-25 NOTE — Telephone Encounter (Signed)
He has Medicare, can't have CPE.  Can schedule a med check for that date so we can address and f/u on his blood pressure and meds. I don't believe any labs are needed--reviewed the ones he had done in January (A1c was slightly elevated--if he wants this repeated to show that his weight loss has helped, we can do that at his visit).

## 2018-12-25 NOTE — Telephone Encounter (Signed)
Called pt he is going to call me back and see what time he could come in the times we had he could not come in he was seeing pts at those times,

## 2019-02-04 DIAGNOSIS — Z03818 Encounter for observation for suspected exposure to other biological agents ruled out: Secondary | ICD-10-CM | POA: Diagnosis not present

## 2019-02-05 ENCOUNTER — Other Ambulatory Visit (INDEPENDENT_AMBULATORY_CARE_PROVIDER_SITE_OTHER): Payer: Medicare Other

## 2019-02-05 ENCOUNTER — Other Ambulatory Visit: Payer: Self-pay

## 2019-02-05 DIAGNOSIS — Z23 Encounter for immunization: Secondary | ICD-10-CM

## 2019-02-28 ENCOUNTER — Other Ambulatory Visit: Payer: Self-pay | Admitting: Family Medicine

## 2019-02-28 DIAGNOSIS — E7800 Pure hypercholesterolemia, unspecified: Secondary | ICD-10-CM

## 2019-03-02 NOTE — Telephone Encounter (Signed)
I don't see a lipid panel done in over a year (11/2017).  Also rec A1c and fasting glucose for impaired FG. Ok to RF x 90d only (no add'l refill).  Will need med check after (virtual, or in office if he will be in town).  [facility] if that [facility] be easier for him.

## 2019-03-02 NOTE — Telephone Encounter (Signed)
Patient asked if I would send rx for the 90 day supply, which I did. He has an upcoming appt with a new doctor at [facility]. He said to send you his love and appreciation for all you have done.

## 2019-03-02 NOTE — Telephone Encounter (Signed)
Is this okay to refill? Not sure when he is due again.

## 2019-03-11 DIAGNOSIS — Z803 Family history of malignant neoplasm of breast: Secondary | ICD-10-CM | POA: Diagnosis not present

## 2019-03-11 DIAGNOSIS — I2510 Atherosclerotic heart disease of native coronary artery without angina pectoris: Secondary | ICD-10-CM | POA: Diagnosis not present

## 2019-03-11 DIAGNOSIS — I10 Essential (primary) hypertension: Secondary | ICD-10-CM | POA: Diagnosis not present

## 2019-03-11 DIAGNOSIS — E785 Hyperlipidemia, unspecified: Secondary | ICD-10-CM | POA: Diagnosis not present

## 2019-03-11 DIAGNOSIS — Z8051 Family history of malignant neoplasm of kidney: Secondary | ICD-10-CM | POA: Diagnosis not present

## 2019-03-22 ENCOUNTER — Other Ambulatory Visit: Payer: Self-pay | Admitting: Family Medicine

## 2019-04-19 ENCOUNTER — Other Ambulatory Visit: Payer: Self-pay | Admitting: Family Medicine

## 2019-04-19 DIAGNOSIS — E7800 Pure hypercholesterolemia, unspecified: Secondary | ICD-10-CM

## 2019-04-19 DIAGNOSIS — I10 Essential (primary) hypertension: Secondary | ICD-10-CM

## 2019-04-30 DIAGNOSIS — L570 Actinic keratosis: Secondary | ICD-10-CM | POA: Diagnosis not present

## 2019-04-30 DIAGNOSIS — L728 Other follicular cysts of the skin and subcutaneous tissue: Secondary | ICD-10-CM | POA: Diagnosis not present

## 2019-04-30 DIAGNOSIS — X32XXXS Exposure to sunlight, sequela: Secondary | ICD-10-CM | POA: Diagnosis not present

## 2019-04-30 DIAGNOSIS — X32XXXA Exposure to sunlight, initial encounter: Secondary | ICD-10-CM | POA: Diagnosis not present

## 2019-04-30 DIAGNOSIS — L578 Other skin changes due to chronic exposure to nonionizing radiation: Secondary | ICD-10-CM | POA: Diagnosis not present

## 2019-04-30 DIAGNOSIS — D225 Melanocytic nevi of trunk: Secondary | ICD-10-CM | POA: Diagnosis not present

## 2019-05-08 ENCOUNTER — Other Ambulatory Visit: Payer: Self-pay | Admitting: Family Medicine

## 2019-05-08 DIAGNOSIS — E7800 Pure hypercholesterolemia, unspecified: Secondary | ICD-10-CM

## 2019-06-11 DIAGNOSIS — Z23 Encounter for immunization: Secondary | ICD-10-CM | POA: Diagnosis not present

## 2019-06-23 DIAGNOSIS — I10 Essential (primary) hypertension: Secondary | ICD-10-CM | POA: Diagnosis not present

## 2019-06-23 DIAGNOSIS — Z125 Encounter for screening for malignant neoplasm of prostate: Secondary | ICD-10-CM | POA: Diagnosis not present

## 2019-06-30 DIAGNOSIS — G8929 Other chronic pain: Secondary | ICD-10-CM | POA: Diagnosis not present

## 2019-06-30 DIAGNOSIS — I10 Essential (primary) hypertension: Secondary | ICD-10-CM | POA: Diagnosis not present

## 2019-06-30 DIAGNOSIS — E785 Hyperlipidemia, unspecified: Secondary | ICD-10-CM | POA: Diagnosis not present

## 2019-06-30 DIAGNOSIS — M545 Low back pain: Secondary | ICD-10-CM | POA: Diagnosis not present

## 2019-07-23 DIAGNOSIS — Z7189 Other specified counseling: Secondary | ICD-10-CM | POA: Diagnosis not present

## 2019-07-23 DIAGNOSIS — L309 Dermatitis, unspecified: Secondary | ICD-10-CM | POA: Diagnosis not present

## 2019-10-24 ENCOUNTER — Other Ambulatory Visit: Payer: Self-pay | Admitting: Family Medicine

## 2019-10-26 NOTE — Telephone Encounter (Signed)
He moved to [place] and has a new PCP

## 2019-10-26 NOTE — Telephone Encounter (Signed)
The pt. Is requesting a refill on his Sildenafil pt. Last apt was 12/04/18 acute, last MWV was 08/14/18. I noticed you are not listed as his PCP not sure if he is still your pt.
